# Patient Record
Sex: Male | Born: 1975 | Race: Black or African American | Hispanic: No | Marital: Married | Smoking: Never smoker
Health system: Southern US, Community
[De-identification: ages and names within clinical notes are randomized; demographics above are authoritative.]

## PROBLEM LIST (undated history)

## (undated) DIAGNOSIS — Z8739 Personal history of other diseases of the musculoskeletal system and connective tissue: Secondary | ICD-10-CM

## (undated) HISTORY — DX: Personal history of other diseases of the musculoskeletal system and connective tissue: Z87.39

## (undated) HISTORY — PX: NO PAST SURGERIES: SHX2092

---

## 2004-11-01 ENCOUNTER — Ambulatory Visit: Payer: Self-pay | Admitting: Family Medicine

## 2006-08-01 ENCOUNTER — Ambulatory Visit: Payer: Self-pay | Admitting: Family Medicine

## 2006-08-01 LAB — CONVERTED CEMR LAB
ALT: 25 units/L (ref 0–40)
AST: 22 units/L (ref 0–37)
Albumin: 4.1 g/dL (ref 3.5–5.2)
Basophils Absolute: 0 10*3/uL (ref 0.0–0.1)
Calcium: 9.7 mg/dL (ref 8.4–10.5)
Chloride: 106 meq/L (ref 96–112)
Creatinine, Ser: 1.1 mg/dL (ref 0.4–1.5)
Eosinophils Absolute: 0.1 10*3/uL (ref 0.0–0.6)
Eosinophils Relative: 2.6 % (ref 0.0–5.0)
GFR calc non Af Amer: 83 mL/min
LDL Cholesterol: 46 mg/dL (ref 0–99)
MCHC: 33.7 g/dL (ref 30.0–36.0)
MCV: 81 fL (ref 78.0–100.0)
Platelets: 230 10*3/uL (ref 150–400)
RBC: 5.42 M/uL (ref 4.22–5.81)
RDW: 13.2 % (ref 11.5–14.6)
Total CHOL/HDL Ratio: 2.5
Triglycerides: 37 mg/dL (ref 0–149)
WBC: 3.6 10*3/uL — ABNORMAL LOW (ref 4.5–10.5)

## 2006-08-08 ENCOUNTER — Ambulatory Visit: Payer: Self-pay | Admitting: Family Medicine

## 2007-03-21 DIAGNOSIS — J302 Other seasonal allergic rhinitis: Secondary | ICD-10-CM | POA: Insufficient documentation

## 2007-12-24 ENCOUNTER — Ambulatory Visit: Payer: Self-pay | Admitting: Family Medicine

## 2007-12-24 LAB — CONVERTED CEMR LAB
ALT: 18 units/L (ref 0–53)
Basophils Absolute: 0 10*3/uL (ref 0.0–0.1)
Bilirubin, Direct: 0.1 mg/dL (ref 0.0–0.3)
Blood in Urine, dipstick: NEGATIVE
CO2: 30 meq/L (ref 19–32)
Calcium: 9.4 mg/dL (ref 8.4–10.5)
Cholesterol: 109 mg/dL (ref 0–200)
HDL: 37.3 mg/dL — ABNORMAL LOW (ref >39.0)
Hemoglobin: 14.6 g/dL (ref 13.0–17.0)
Ketones, urine, test strip: NEGATIVE
LDL Cholesterol: 58 mg/dL (ref 0–99)
Lymphocytes Relative: 39.5 % (ref 12.0–46.0)
MCHC: 33.8 g/dL (ref 30.0–36.0)
Neutro Abs: 2.2 10*3/uL (ref 1.4–7.7)
Neutrophils Relative %: 48.5 % (ref 43.0–77.0)
Nitrite: NEGATIVE
Protein, U semiquant: NEGATIVE
RDW: 13.4 % (ref 11.5–14.6)
Sodium: 141 meq/L (ref 135–145)
Total Bilirubin: 0.7 mg/dL (ref 0.3–1.2)
Triglycerides: 69 mg/dL (ref 0–149)
Urobilinogen, UA: 0.2
VLDL: 14 mg/dL (ref 0–40)

## 2007-12-30 ENCOUNTER — Ambulatory Visit: Payer: Self-pay | Admitting: Family Medicine

## 2008-06-09 DIAGNOSIS — J069 Acute upper respiratory infection, unspecified: Secondary | ICD-10-CM | POA: Insufficient documentation

## 2008-06-09 DIAGNOSIS — M545 Low back pain, unspecified: Secondary | ICD-10-CM | POA: Insufficient documentation

## 2008-06-23 ENCOUNTER — Ambulatory Visit: Payer: Self-pay | Admitting: Family Medicine

## 2010-09-15 ENCOUNTER — Other Ambulatory Visit: Payer: Self-pay

## 2010-09-22 ENCOUNTER — Ambulatory Visit: Payer: Self-pay | Admitting: Family Medicine

## 2010-10-13 ENCOUNTER — Ambulatory Visit (INDEPENDENT_AMBULATORY_CARE_PROVIDER_SITE_OTHER): Payer: BC Managed Care – PPO | Admitting: Family Medicine

## 2010-10-13 ENCOUNTER — Encounter: Payer: Self-pay | Admitting: Family Medicine

## 2010-10-13 VITALS — BP 132/80 | Temp 98.1°F | Ht 76.0 in | Wt 250.0 lb

## 2010-10-13 DIAGNOSIS — M545 Low back pain, unspecified: Secondary | ICD-10-CM

## 2010-10-13 MED ORDER — PREDNISONE 20 MG PO TABS: ORAL_TABLET | ORAL | Status: DC

## 2010-10-13 NOTE — Progress Notes (Signed)
  Subjective:    Patient ID: Ricky Watkins, male    DOB: September 29, 1975, 35 y.o.   MRN: [id number]  HPI M. Is a 35 year old, married man, nonsmoker, who comes in today for evaluation of low back pain.  Two weeks ago he began having low back pain.  He describes it as central and he points to the L5-S1 disk area as the source of his pain.  He states it comes and goes.  When he sitting his pain free if he twists it hurts.  It all at this last for a few seconds and goes away.  Is able to sleep at night.  The intensity of the pain is a 6 on a scale of one to 10.  He had a similar episode like this for 5 years ago.  We gave him a short course of prednisone and his pain went away.   Review of Systems    General muscle disk L2 review of systems otherwise negative Objective:   Physical Exam Of the upper nurse menarche at distress.  Examination spine is normal and lower extremities.  Strength sensation, reflexes, all within normal limits.  Straight leg raising negative to       Assessment & Plan:  Central lumbar disk with normal neurologic exam.  Plan prednisone burst and taper, PT if symptoms do not abate

## 2010-10-13 NOTE — Patient Instructions (Signed)
Take the prednisone as directed.  If you don't see that your symptoms are improving.  Call us and we will get she set up for physical therapy.  Remember to do the stretching exercises 15 minutes twice daily

## 2010-11-28 ENCOUNTER — Encounter: Payer: Self-pay | Admitting: Family Medicine

## 2010-11-28 ENCOUNTER — Ambulatory Visit (INDEPENDENT_AMBULATORY_CARE_PROVIDER_SITE_OTHER): Payer: BC Managed Care – PPO | Admitting: Family Medicine

## 2010-11-28 VITALS — BP 124/84 | Temp 98.3°F | Wt 250.0 lb

## 2010-11-28 DIAGNOSIS — M545 Low back pain, unspecified: Secondary | ICD-10-CM

## 2010-11-28 NOTE — Progress Notes (Signed)
  Subjective:    Patient ID: Ricky Watkins, male    DOB: 02/01/1976, 35 y.o.   MRN: [id number]  HPImaurice  Is a 35 y/o male Who comes in today for evaluation of low back pain.  We saw him a month ago with severe low back pain.  At that time, we felt he had an L4, L5.  The left lateral disk with no neurologic deficit.  We therefore, start him on a short course of prednisone with home exercises.  He tapered his prednisone felt well and in his back pain flared up again.  A week ago.  He restarted the prednisone is now down to 20 mg a day.  He said his pain is decreased, but not gone.  He describes it as sometimes sharp sometimes dull points to the left lateral SI joint as a source of his pain.  He occasionally has some tingling in his left leg, but does not know go below his knee.  His sensation and muscle strength.  Oral normal.  Historically, he said, recurrent problems like this in the past and has gotten better in the past with physical therapy.  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  He was given a week out which is due to and tomorrow.  The physician at work recommend that he have an MRI however, without any neurologic deficit.  I do not think an MRI would be fruitful at this time    Review of Systems    General and musculoskeletal and neurologic review of systems otherwise negative Objective:   Physical Exam    Well-developed well-nourished, muscular male in no acute distress.  Examination of the spine is normal.  No palpable tenderness.  In the supine position, and sensation.  Muscle strength reflexes all normal.  Straight leg raising negative however, he does have extremely tight hamstrings 35 to 45 degrees limitation   Assessment & Plan:  Lumbar disk disease,,,,,,,,,,,, Taper prednisone,,,,,,,,, begin physical therapy,,,,,,,,, consider epidural steroid injection.  If symptoms do not improve with  physical therapy,,,,,,,,,,,,, should stay out of the line of duty until he is 100% well

## 2010-11-28 NOTE — Patient Instructions (Signed)
Taper the prednisone by taking a half a tablet a day, x 3 days, then half a tablet Monday, Wednesday, Friday, for a two week taper.  Once u r   finished the prednisone begin Motrin 600 b.i.d. With food.  We will start u  on physical therapy ASAP.  Check at work to see if you can do something inside for the next two to 4 weeks until we can get you pain free.  If the physical therapy does not work then that we would go and get an epidural steroid injection at the pain center

## 2010-12-12 ENCOUNTER — Ambulatory Visit: Payer: BC Managed Care – PPO | Attending: Family Medicine

## 2010-12-12 DIAGNOSIS — M25569 Pain in unspecified knee: Secondary | ICD-10-CM | POA: Insufficient documentation

## 2010-12-12 DIAGNOSIS — IMO0001 Reserved for inherently not codable concepts without codable children: Secondary | ICD-10-CM | POA: Insufficient documentation

## 2010-12-12 DIAGNOSIS — M25559 Pain in unspecified hip: Secondary | ICD-10-CM | POA: Insufficient documentation

## 2010-12-15 ENCOUNTER — Ambulatory Visit: Payer: BC Managed Care – PPO | Admitting: Physical Therapy

## 2010-12-23 ENCOUNTER — Telehealth: Payer: Self-pay | Admitting: *Deleted

## 2010-12-23 DIAGNOSIS — M545 Low back pain, unspecified: Secondary | ICD-10-CM

## 2010-12-23 NOTE — Telephone Encounter (Signed)
Have Dr. Tawanna Cooler see about this

## 2010-12-23 NOTE — Telephone Encounter (Signed)
Pt. Wants to change PT locations.  He cannot get enough appts to fulfill his PT plan of treatment.

## 2010-12-26 ENCOUNTER — Encounter: Payer: Self-pay | Admitting: Family Medicine

## 2010-12-26 ENCOUNTER — Ambulatory Visit (INDEPENDENT_AMBULATORY_CARE_PROVIDER_SITE_OTHER): Payer: BC Managed Care – PPO | Admitting: Family Medicine

## 2010-12-26 ENCOUNTER — Ambulatory Visit (INDEPENDENT_AMBULATORY_CARE_PROVIDER_SITE_OTHER)
Admission: RE | Admit: 2010-12-26 | Discharge: 2010-12-26 | Disposition: A | Discharge status: Home / Self Care | Payer: BC Managed Care – PPO | Source: Ambulatory Visit | Attending: Family Medicine | Admitting: Family Medicine

## 2010-12-26 VITALS — BP 130/96 | Temp 98.0°F | Wt 248.0 lb

## 2010-12-26 DIAGNOSIS — M545 Low back pain, unspecified: Secondary | ICD-10-CM

## 2010-12-26 MED ORDER — HYDROCODONE-ACETAMINOPHEN 7.5-750 MG PO TABS: ORAL_TABLET | ORAL | Status: DC

## 2010-12-26 NOTE — Patient Instructions (Signed)
Go to the main office now for x-rays of your spine.  We will get set up for an MRI.  Vicodin one half to one tablet 3 to 4 times daily as needed for pain.  We will also get set up for a neurosurgical consult for further evaluation

## 2010-12-26 NOTE — Telephone Encounter (Signed)
Please send this message to terri,,,,,,,,,, located change locations

## 2010-12-26 NOTE — Progress Notes (Signed)
Addended by: Kern Reap B on: 12/26/2010 04:46 PM   Modules accepted: Orders

## 2010-12-26 NOTE — Progress Notes (Signed)
  Subjective:    Patient ID: Ricky Watkins, male    DOB: 1976-04-20, 35 y.o.   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  We gave him a burst and taper of prednisone and started physical therapy.  This has worked in the past, however, this time.  It did work.  He's been off his prednisone and still has persistent pain.  He is to types of pain.  One is a constant, dull pain, which he describes as about 5 the other is a severe, sharp pain that he describes as an ache that radiates down to his left mid calf.  The pain is constant and hurts worse by sitting somewhat better if he lies down.   Review of Systems    General and neurologic review of systems negative Objective:   Physical Exam    Well-developed well-nourished, male in no acute distress.  Neurologic exam exam unchanged   Assessment & Plan:  Lumbar disk disease, unresolved, and unresponsive to conservative therapy.  Plan Will get plain x-rays, MRI, referred to neurosurgery for eval

## 2010-12-27 ENCOUNTER — Other Ambulatory Visit: Payer: Self-pay | Admitting: Family Medicine

## 2010-12-27 DIAGNOSIS — M545 Low back pain, unspecified: Secondary | ICD-10-CM

## 2010-12-29 NOTE — Progress Notes (Signed)
patient  Is aware 

## 2011-01-02 ENCOUNTER — Ambulatory Visit
Admission: RE | Admit: 2011-01-02 | Discharge: 2011-01-02 | Disposition: A | Discharge status: Home / Self Care | Payer: BC Managed Care – PPO | Source: Ambulatory Visit | Attending: Family Medicine | Admitting: Family Medicine

## 2011-01-02 ENCOUNTER — Other Ambulatory Visit: Payer: BC Managed Care – PPO

## 2011-01-02 ENCOUNTER — Inpatient Hospital Stay: Admission: RE | Admit: 2011-01-02 | Payer: BC Managed Care – PPO | Source: Ambulatory Visit

## 2011-01-02 ENCOUNTER — Other Ambulatory Visit: Payer: Self-pay | Admitting: Family Medicine

## 2011-01-02 DIAGNOSIS — M545 Low back pain, unspecified: Secondary | ICD-10-CM

## 2011-01-04 NOTE — Progress Notes (Signed)
patient  Is aware and went to dr elsner's office today.  He will call back when he is ready for PT

## 2011-01-06 ENCOUNTER — Ambulatory Visit: Payer: BC Managed Care – PPO

## 2011-01-09 ENCOUNTER — Telehealth: Payer: Self-pay | Admitting: *Deleted

## 2011-01-09 NOTE — Telephone Encounter (Signed)
ok 

## 2011-01-09 NOTE — Telephone Encounter (Signed)
patient  Would like a note stating he may return to work

## 2011-01-10 NOTE — Telephone Encounter (Signed)
Letter ready and patient is aware 

## 2011-07-12 ENCOUNTER — Encounter: Payer: Self-pay | Admitting: Family Medicine

## 2011-07-13 ENCOUNTER — Ambulatory Visit: Payer: BC Managed Care – PPO | Admitting: Family Medicine

## 2011-07-27 ENCOUNTER — Ambulatory Visit: Payer: BC Managed Care – PPO | Admitting: Family Medicine

## 2011-09-06 ENCOUNTER — Other Ambulatory Visit: Payer: Self-pay | Admitting: Family Medicine

## 2011-10-18 ENCOUNTER — Telehealth: Payer: Self-pay | Admitting: Family Medicine

## 2011-10-18 MED ORDER — BENZOYL PEROXIDE-ERYTHROMYCIN 5-3 % EX GEL: Freq: Every day | CUTANEOUS | Status: DC

## 2011-10-18 NOTE — Telephone Encounter (Signed)
Patient called stating that he need a refill of his benzoyl peroxide-erythromycin sent to his pharmacy [facility] in [place] [address]. Please assist.

## 2012-10-17 ENCOUNTER — Other Ambulatory Visit: Payer: Self-pay | Admitting: Family Medicine

## 2013-03-02 ENCOUNTER — Other Ambulatory Visit: Payer: Self-pay | Admitting: Family Medicine

## 2013-07-14 ENCOUNTER — Other Ambulatory Visit: Payer: Self-pay

## 2013-07-14 ENCOUNTER — Telehealth: Payer: Self-pay | Admitting: Family Medicine

## 2013-07-14 ENCOUNTER — Emergency Department (HOSPITAL_COMMUNITY): Payer: BC Managed Care – PPO

## 2013-07-14 ENCOUNTER — Emergency Department (HOSPITAL_COMMUNITY)
Admission: EM | Admit: 2013-07-14 | Discharge: 2013-07-14 | Disposition: A | Discharge status: Home / Self Care | Payer: BC Managed Care – PPO | Attending: Emergency Medicine | Admitting: Emergency Medicine

## 2013-07-14 ENCOUNTER — Encounter (HOSPITAL_COMMUNITY): Payer: Self-pay | Admitting: Emergency Medicine

## 2013-07-14 DIAGNOSIS — J989 Respiratory disorder, unspecified: Secondary | ICD-10-CM

## 2013-07-14 DIAGNOSIS — R0989 Other specified symptoms and signs involving the circulatory and respiratory systems: Secondary | ICD-10-CM | POA: Insufficient documentation

## 2013-07-14 DIAGNOSIS — J45909 Unspecified asthma, uncomplicated: Secondary | ICD-10-CM | POA: Insufficient documentation

## 2013-07-14 DIAGNOSIS — J309 Allergic rhinitis, unspecified: Secondary | ICD-10-CM | POA: Insufficient documentation

## 2013-07-14 LAB — BASIC METABOLIC PANEL
BUN: 13 mg/dL (ref 6–23)
CALCIUM: 9.8 mg/dL (ref 8.4–10.5)
CO2: 28 mEq/L (ref 19–32)
Chloride: 100 mEq/L (ref 96–112)
Creatinine, Ser: 1.04 mg/dL (ref 0.50–1.35)
GFR, EST NON AFRICAN AMERICAN: 90 mL/min — AB (ref >90)
GLUCOSE: 103 mg/dL — AB (ref 70–99)
POTASSIUM: 4.9 meq/L (ref 3.7–5.3)
SODIUM: 139 meq/L (ref 137–147)

## 2013-07-14 LAB — CBC
HCT: 43.5 % (ref 39.0–52.0)
HEMOGLOBIN: 14.7 g/dL (ref 13.0–17.0)
MCH: 27.4 pg (ref 26.0–34.0)
MCHC: 33.8 g/dL (ref 30.0–36.0)
MCV: 81.2 fL (ref 78.0–100.0)
PLATELETS: 187 10*3/uL (ref 150–400)
RBC: 5.36 MIL/uL (ref 4.22–5.81)
RDW: 13.8 % (ref 11.5–15.5)
WBC: 6.4 10*3/uL (ref 4.0–10.5)

## 2013-07-14 LAB — POCT I-STAT TROPONIN I: TROPONIN I, POC: 0.01 ng/mL (ref 0.00–0.08)

## 2013-07-14 MED ORDER — PREDNISONE 10 MG PO TABS: 20.0000 mg | ORAL_TABLET | Freq: Every day | ORAL | Status: DC

## 2013-07-14 MED ORDER — ALBUTEROL SULFATE HFA 108 (90 BASE) MCG/ACT IN AERS
2.0000 | INHALATION_SPRAY | RESPIRATORY_TRACT | Status: DC | PRN
  Administered 2013-07-14: 2 via RESPIRATORY_TRACT
  Filled 2013-07-14: qty 6.7

## 2013-07-14 MED ORDER — ALBUTEROL SULFATE (2.5 MG/3ML) 0.083% IN NEBU
5.0000 mg | INHALATION_SOLUTION | Freq: Once | RESPIRATORY_TRACT | Status: AC
  Administered 2013-07-14: 5 mg via RESPIRATORY_TRACT
  Filled 2013-07-14: qty 6

## 2013-07-14 MED ORDER — IPRATROPIUM BROMIDE 0.02 % IN SOLN
0.5000 mg | Freq: Once | RESPIRATORY_TRACT | Status: AC
  Administered 2013-07-14: 0.5 mg via RESPIRATORY_TRACT
  Filled 2013-07-14: qty 2.5

## 2013-07-14 MED ORDER — PREDNISONE 20 MG PO TABS
60.0000 mg | ORAL_TABLET | Freq: Once | ORAL | Status: AC
  Administered 2013-07-14: 60 mg via ORAL
  Filled 2013-07-14: qty 3

## 2013-07-14 NOTE — Telephone Encounter (Signed)
FYI! PT to follow CAN advise.

## 2013-07-14 NOTE — Discharge Instructions (Signed)
Asthma, Acute Bronchospasm °Acute bronchospasm caused by asthma is also referred to as an asthma attack. Bronchospasm means your air passages become narrowed. The narrowing is caused by inflammation and tightening of the muscles in the air tubes (bronchi) in your lungs. This can make it hard to breath or cause you to wheeze and cough. °CAUSES °Possible triggers are: °· Animal dander from the skin, hair, or feathers of animals. °· Dust mites contained in house dust. °· Cockroaches. °· Pollen from trees or grass. °· Mold. °· Cigarette or tobacco smoke. °· Air pollutants such as dust, household cleaners, hair sprays, aerosol sprays, paint fumes, strong chemicals, or strong odors. °· Cold air or weather changes. Cold air may trigger inflammation. Winds increase molds and pollens in the air. °· Strong emotions such as crying or laughing hard. °· Stress. °· Certain medicines such as aspirin or beta-blockers. °· Sulfites in foods and drinks, such as dried fruits and wine. °· Infections or inflammatory conditions, such as a flu, cold, or inflammation of the nasal membranes (rhinitis). °· Gastroesophageal reflux disease (GERD). GERD is a condition where stomach acid backs up into your throat (esophagus). °· Exercise or strenuous activity. °SIGNS AND SYMPTOMS  °· Wheezing. °· Excessive coughing, particularly at night. °· Chest tightness. °· Shortness of breath. °DIAGNOSIS  °Your health care provider will ask you about your medical history and perform a physical exam. A chest X-ray or blood testing may be performed to look for other causes of your symptoms or other conditions that may have triggered your asthma attack.  °TREATMENT  °Treatment is aimed at reducing inflammation and opening up the airways in your lungs.  Most asthma attacks are treated with inhaled medicines. These include quick relief or rescue medicines (such as bronchodilators) and controller medicines (such as inhaled corticosteroids). These medicines are  sometimes given through an inhaler or a nebulizer. Systemic steroid medicine taken by mouth or given through an IV tube also can be used to reduce the inflammation when an attack is moderate or severe. Antibiotic medicines are only used if a bacterial infection is present.  °HOME CARE INSTRUCTIONS  °· Rest. °· Drink plenty of liquids. This helps the mucus to remain thin and be easily coughed up. Only use caffeine in moderation and do not use alcohol until you have recovered from your illness. °· Do not smoke. Avoid being exposed to secondhand smoke. °· You play a critical role in keeping yourself in good health. Avoid exposure to things that cause you to wheeze or to have breathing problems. °· Keep your medicines up to date and available. Carefully follow your health care provider's treatment plan. °· Take your medicine exactly as prescribed. °· When pollen or pollution is bad, keep windows closed and use an air conditioner or go to places with air conditioning. °· Asthma requires careful medical care. See your health care provider for a follow-up as advised. If you are more than [redacted] weeks pregnant and you were prescribed any new medicines, let your obstetrician know about the visit and how you are doing. Follow-up with your health care provider as directed. °· After you have recovered from your asthma attack, make an appointment with your outpatient doctor to talk about ways to reduce the likelihood of future attacks. If you do not have a doctor who manages your asthma, make an appointment with a primary care doctor to discuss your asthma. °SEEK IMMEDIATE MEDICAL CARE IF:  °· You are getting worse. °· You have trouble breathing. If severe, call   your local emergency services (911 in the [place] °· You develop chest pain or discomfort. °· You are vomiting. °· You are not able to keep fluids down. °· You are coughing up yellow, green, brown, or bloody sputum. °· You have a fever and your symptoms suddenly get  worse. °· You have trouble swallowing. °MAKE SURE YOU:  °· Understand these instructions. °· Will watch your condition. °· Will get help right away if you are not doing well or get worse. °Document Released: 09/27/2006 Document Revised: 02/12/2013 Document Reviewed: 12/18/2012 °Exit[employer]® Patient Information ©2014 [employer]. ° °

## 2013-07-14 NOTE — ED Provider Notes (Signed)
Medical screening examination/treatment/procedure(s) were performed by non-physician practitioner and as supervising physician I was immediately available for consultation/collaboration.  EKG Interpretation    Date/Time:  Monday July 14 2013 11:32:10 EST Ventricular Rate:  84 PR Interval:  178 QRS Duration: 84 QT Interval:  318 QTC Calculation: 375 R Axis:   73 Text Interpretation:  Normal sinus rhythm Early repolarization Normal ECG No previous tracing Confirmed by Maryan Rued  MD, Rindy Kollman ([id number]) on 07/14/2013 1:29:33 PM              Blanchie Dessert, MD 07/14/13 1608

## 2013-07-14 NOTE — Telephone Encounter (Signed)
Patient Information:  Caller Name: Marcelina Morel  Phone: [phone]  Patient: Ricky Watkins, Ricky Watkins  Gender: Male  DOB: 1975-10-29  Age: 38 Years  PCP: [facility])  Office Follow Up:  Does the office need to follow up with this patient?: No  Instructions For The Office: N/A  RN Note:  Substernal chest pain described as pressure and present now "like an elephant standing on my chest." Pain is worsened with deep inspiration but present without deep breath.  Symptoms  Reason For Call & Symptoms: Substernal chest pain up to throat and shoulder to shoulder since 2100 07/13/13; pain worsened when got home at 0430 07/14/13 and if lays down.  Caller related pain onset to wearing tight bullet proof vest and standing outside in cold air.  Reviewed Health History In EMR: Yes  Reviewed Medications In EMR: Yes  Reviewed Allergies In EMR: Yes  Reviewed Surgeries / Procedures: Yes  Date of Onset of Symptoms: 07/13/2013  Guideline(s) Used:  Chest Pain  Disposition Per Guideline:   Call EMS 911 Now  Reason For Disposition Reached:   Chest pain lasting longer than 5 minutes and ANY of the following:  Over 2 years old Over 87 years old and at least one cardiac risk factor (i.e., high blood pressure, diabetes, high cholesterol, obesity, smoker or strong family history of heart disease) Pain is crushing, pressure-like, or heavy  Took nitroglycerin and chest pain was not relieved History of heart disease (i.e., angina, heart attack, bypass surgery, angioplasty, CHF)  Advice Given:  N/A  Patient Will Follow Care Advice:  YES

## 2013-07-14 NOTE — ED Notes (Signed)
PT c/o chest tightness since last night.  States was tx for bronchitis 3 weeks ago and this feels the same.  Tightness increases with inspiration.

## 2013-07-14 NOTE — ED Provider Notes (Signed)
CSN: [id number]     Arrival date & time 07/14/13  1129 History   First MD Initiated Contact with Patient 07/14/13 1256     Chief Complaint  Patient presents with  . Chest Pain   (Consider location/radiation/quality/duration/timing/severity/associated sxs/prior Treatment) HPI  Patient to is otherwise a healthy 38 year old male presents to the emergency department for evaluation of chest tightness. 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 He states that the tightness and pain started during the training. He denies feeling short of breath but felt as though his lungs were tight. He tried loosening his bullet proof best but it did not help much. He does not have an inhaler to try at home. He denies having cardiac history or history of chest pains. He denies feeling diaphoretic, short of breath, nausea, weakness during this episode. He laid down to go to sleep that night he still felt a tightness in his lungs but in the morning woke up asymptomatic.  Past Medical History  Diagnosis Date  . Allergic rhinitis    History reviewed. No pertinent past surgical history. No family history on file. History  Substance Use Topics  . Smoking status: Never Smoker   . Smokeless tobacco: Never Used  . Alcohol Use: Yes     Comment: rarley    Review of Systems The patient denies anorexia, fever, weight loss,, vision loss, decreased hearing, hoarseness, chest pain, syncope, dyspnea on exertion, peripheral edema, balance deficits, hemoptysis, abdominal pain, melena, hematochezia, severe indigestion/heartburn, hematuria, incontinence, genital sores, muscle weakness, suspicious skin lesions, transient blindness, difficulty walking, depression, unusual weight change, abnormal bleeding, enlarged lymph nodes, angioedema, and breast masses.   Allergies  Review of patient's allergies indicates no known  allergies.  Home Medications   Current Outpatient Rx  Name  Route  Sig  Dispense  Refill  . cetirizine (ZYRTEC) 10 MG tablet   Oral   Take 10 mg by mouth daily.           . mometasone (NASONEX) 50 MCG/ACT nasal spray   Nasal   Place 1 spray into the nose daily as needed (allergies). Each nostril         . Multiple Vitamin (MULTIVITAMIN) capsule   Oral   Take 1 capsule by mouth every 3 (three) days.         . predniSONE (DELTASONE) 10 MG tablet   Oral   Take 2 tablets (20 mg total) by mouth daily.   21 tablet   0     Prednisone dose pack directions:   6 tabs on day ...    BP 136/68  Pulse 91  Temp(Src) 97.8 F (36.6 C) (Oral)  Resp 12  Ht 6\' 5"  (1.956 m)  Wt 229 lb 4.8 oz (104.01 kg)  BMI 27.19 kg/m2  SpO2 98% Physical Exam  Nursing note and vitals reviewed. Constitutional: He appears well-developed and well-nourished. No distress.  HENT:  Head: Normocephalic and atraumatic.  Eyes: Pupils are equal, round, and reactive to light.  Neck: Normal range of motion. Neck supple.  Cardiovascular: Normal rate and regular rhythm.   Pulmonary/Chest: Effort normal. He has decreased breath sounds. He has wheezes (mild wheezing after breathing treatment). He has no rhonchi.  Abdominal: Soft.  Neurological: He is alert.  Skin: Skin is warm and dry.    ED Course  Procedures (including critical care time) Labs Review Labs  Reviewed  BASIC METABOLIC PANEL - Abnormal; Notable for the following:    Glucose, Bld 103 (*)    GFR calc non Af Amer 90 (*)    All other components within normal limits  CBC  POCT I-STAT TROPONIN I   Imaging Review Dg Chest 2 View (if Patient Has Fever And/or Copd)  07/14/2013   CLINICAL DATA:  Chest pain shortness of breath.  EXAM: CHEST  2 VIEW  COMPARISON:  None.  FINDINGS: The heart size and mediastinal contours are within normal limits. Both lungs are clear. The visualized skeletal structures are unremarkable.  IMPRESSION: No active  cardiopulmonary disease.   Electronically Signed   By: Marcello Moores  Register   On: 07/14/2013 12:59    EKG Interpretation    Date/Time:  Monday July 14 2013 11:32:10 EST Ventricular Rate:  84 PR Interval:  178 QRS Duration: 84 QT Interval:  318 QTC Calculation: 375 R Axis:   73 Text Interpretation:  Normal sinus rhythm Early repolarization Normal ECG No previous tracing Confirmed by Maryan Rued  MD, WHITNEY ([id number]) on 07/14/2013 1:29:33 PM            MDM   1. Reactive airway disease that is not asthma    Chest xray and labs are all WNL. Patient having decrease airway movement  Given a duo neb in the ED which he says significantly helped his symptoms. On re-evaluation he has improved airway movement on ascultation. Will give RX for albuterol inhaler and prednisone taper.  38 y.o.Torence L Eoff's evaluation in the Emergency Department is complete. It has been determined that no acute conditions requiring further emergency intervention are present at this time. The patient/guardian have been advised of the diagnosis and plan. We have discussed signs and symptoms that warrant return to the ED, such as changes or worsening in symptoms.  Vital signs are stable at discharge. Filed Vitals:   07/14/13 1400  BP: 136/68  Pulse:   Temp:   Resp: 12    Patient/guardian has voiced understanding and agreed to follow-up with the PCP or specialist.     Linus Mako, PA-C 07/14/13 1429

## 2013-08-11 ENCOUNTER — Encounter (HOSPITAL_COMMUNITY): Payer: Self-pay | Admitting: Emergency Medicine

## 2013-08-11 ENCOUNTER — Emergency Department (INDEPENDENT_AMBULATORY_CARE_PROVIDER_SITE_OTHER)
Admission: EM | Admit: 2013-08-11 | Discharge: 2013-08-11 | Disposition: A | Discharge status: Home / Self Care | Payer: BC Managed Care – PPO | Source: Home / Self Care | Attending: Family Medicine | Admitting: Family Medicine

## 2013-08-11 DIAGNOSIS — S76309A Unspecified injury of muscle, fascia and tendon of the posterior muscle group at thigh level, unspecified thigh, initial encounter: Secondary | ICD-10-CM

## 2013-08-11 DIAGNOSIS — S79929A Unspecified injury of unspecified thigh, initial encounter: Secondary | ICD-10-CM

## 2013-08-11 DIAGNOSIS — S79919A Unspecified injury of unspecified hip, initial encounter: Secondary | ICD-10-CM

## 2013-08-11 DIAGNOSIS — S6000XA Contusion of unspecified finger without damage to nail, initial encounter: Secondary | ICD-10-CM

## 2013-08-11 NOTE — ED Provider Notes (Signed)
Ricky Watkins is a 38 y.o. male who presents to Urgent Care today for left hamstring and left hand pain. 1) left hamstring: Patient slipped while in the gym about one week ago. He felt a pulling sensation in his distal left hamstring. He's developed bruising in his distal hamstring. He notes mild pain but feels well otherwise. He has no trouble running or jumping. He feels well.  2) left hand: During the fall the gym he jammed his left second and third PIP. He denies any significant pain. He denies any limitation with his activities such as gripping or extension. He feels well. Mild pain. No medications tried.  No radiating pain weakness or numbness  Social history: Patient works as a Engineer, structural Past Medical History  Diagnosis Date  . Allergic rhinitis    History  Substance Use Topics  . Smoking status: Never Smoker   . Smokeless tobacco: Never Used  . Alcohol Use: Yes     Comment: rarley   ROS as above Medications: No current facility-administered medications for this encounter.   Current Outpatient Prescriptions  Medication Sig Dispense Refill  . cetirizine (ZYRTEC) 10 MG tablet Take 10 mg by mouth daily.        . mometasone (NASONEX) 50 MCG/ACT nasal spray Place 1 spray into the nose daily as needed (allergies). Each nostril      . Multiple Vitamin (MULTIVITAMIN) capsule Take 1 capsule by mouth every 3 (three) days.        Exam:  BP 129/84  Pulse 85  Temp(Src) 98.3 F (36.8 C) (Oral)  Resp 16  SpO2 96% Gen: Well NAD Left hand: Normal-appearing no swelling. Mildly tender left second and third PIP Normal motion. Normal extension and flexion strength are capillary refill and sensation are intact distally.  Contralateral right hand is normal  Left hamstring: Ecchymosis present distal medial tibia to the medial calf.  Not particularly tender. No defects palpated Normal H-test.  Strength is intact.   Contralateral right hamstring is normal:    Limited  musculoskeletal ultrasound of the left distal hamstring:  Heterogeneity superficially along the area of ecchymosis and tenderness consistent with hematoma. No large defect noted in the hamstring tendons. Slight small irregularity of the semitendinosus distally. Otherwise normal   Assessment and Plan: 38 y.o. male with  1) partial tear left hamstring:  Doing well.  Plan for Askling Exercises.  2) left hand contusion: NSAIDs as needed  Discussed warning signs or symptoms. Please see discharge instructions. Patient expresses understanding.    Gregor Hams, MD 08/11/13 [phone]

## 2013-08-11 NOTE — Discharge Instructions (Signed)
Thank you for coming in today. Askling hamstring exercises.  Follow up with Dr. Tamala Julian as needed.  Take up to 2 aleve twice daily for pain as needed.  Hamstring Strain  Hamstrings are the large muscles in the back of the thighs. A strain or tear injury happens when there is a sudden stretch or pull on these muscles and tendons. Tendons are cord like structures that attach muscle to bone. These injuries are commonly seen in activities such as sprinting due to sudden acceleration.  DIAGNOSIS  Often the diagnosis can be made by examination. HOME CARE INSTRUCTIONS   Apply ice to the sore area for 15-81minutes, 03-04 times per day. Do this while awake for the first 2 days. Put the ice in a plastic bag, and place a towel between the bag of ice and your skin.  Keep your knee flexed when possible. This means your foot is held off the ground slightly if you are on crutches. When lying down, a pillow under the knee will take strain off the muscles and provide some relief.  If a compression bandage such as an ace wrap was applied, use it until you are seen again. You may remove it for sleeping, showers and baths. If the wrap seems to be too tight and is uncomfortable, wrap it more loosely. If your toes or foot are getting cold or blue, it is too tight.  Walk or move around as the pain allows, or as instructed. Resume full activities as suggested by your caregiver. This is often safest when the strength of the injured leg has nearly returned to normal.  Only take over-the-counter or prescription medicines for pain, discomfort, or fever as directed by your caregiver. SEEK MEDICAL CARE IF:   You have an increase in bruising, swelling or pain.  You notice coldness or blueness of your toes or foot.  Pain relief is not obtained with medications.  You have increasing pain in the area and seem to be getting worse rather than better.  You notice your thigh getting larger in size (this could indicate bleeding  into the muscle). Document Released: 03/07/2001 Document Revised: 09/04/2011 Document Reviewed: 06/14/2008 [facility] Patient Information 2014 [place], [place].

## 2013-08-11 NOTE — ED Notes (Signed)
States he has pain in left hand and left posterior thigh that resulted from workout 1 week ago . Wife ENCOURAGED him to be evaluated

## 2014-03-02 ENCOUNTER — Other Ambulatory Visit: Payer: Self-pay | Admitting: Family Medicine

## 2014-09-14 ENCOUNTER — Encounter: Payer: Self-pay | Admitting: Family Medicine

## 2014-09-14 ENCOUNTER — Ambulatory Visit (INDEPENDENT_AMBULATORY_CARE_PROVIDER_SITE_OTHER): Payer: BC Managed Care – PPO | Admitting: Family Medicine

## 2014-09-14 VITALS — BP 130/90 | HR 80 | Temp 98.3°F | Ht 76.0 in | Wt 234.5 lb

## 2014-09-14 DIAGNOSIS — J302 Other seasonal allergic rhinitis: Secondary | ICD-10-CM

## 2014-09-14 MED ORDER — MOMETASONE FUROATE 50 MCG/ACT NA SUSP: 1.0000 | Freq: Every day | NASAL | Status: DC | PRN

## 2014-09-14 NOTE — Patient Instructions (Addendum)
Good to see you today, call us with questions. Return as needed or for physical at your convenience, prior fasting for blood work nasonex refilled.

## 2014-09-14 NOTE — Assessment & Plan Note (Signed)
Refilled nasonex per pt request. Discussed antihistamine use.

## 2014-09-14 NOTE — Progress Notes (Signed)
BP 130/90 mmHg  Pulse 80  Temp(Src) 98.3 F (36.8 C) (Oral)  Ht 6\' 4"  (1.93 m)  Wt 234 lb 8 oz (106.369 kg)  BMI 28.56 kg/m2   CC: transfer of care  Subjective:    Patient ID: Ricky Watkins, male    DOB: 08-12-1975, 39 y.o.   MRN: [id number]  HPI: Ricky Watkins is a 39 y.o. male presenting on 09/14/2014 for Establish Care   Prior saw Dr Christie Nottingham, last seen 12/2010.  Seasonal allergic rhinitis - currently no issues. Requests refill of nasonex. Both congestion and rhinorrhea. No watery eyes or sneezing. Environmental allergies - dust mites, grass.   Preventative: Last CPE 07/2013 at [facility] Requests we schedule at another date.  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 Interim medical history since our last visit reviewed. Allergies and medications reviewed and updated. Current Outpatient Prescriptions on File Prior to Visit  Medication Sig  . cetirizine (ZYRTEC) 10 MG tablet Take 10 mg by mouth daily.     No current facility-administered medications on file prior to visit.    Review of Systems Per HPI unless specifically indicated above     Objective:    BP 130/90 mmHg  Pulse 80  Temp(Src) 98.3 F (36.8 C) (Oral)  Ht 6\' 4"  (1.93 m)  Wt 234 lb 8 oz (106.369 kg)  BMI 28.56 kg/m2  Wt Readings from Last 3 Encounters:  09/14/14 234 lb 8 oz (106.369 kg)  07/14/13 229 lb 4.8 oz (104.01 kg)  12/26/10 248 lb (112.492 kg)    Physical Exam  Constitutional: He is oriented to person, place, and time. He appears well-developed and well-nourished. No distress.  HENT:  Head: Normocephalic and atraumatic.  Right Ear: Hearing, tympanic membrane, external ear and ear canal normal.  Left Ear: Hearing, tympanic membrane, external ear  and ear canal normal.  Nose: Nose normal.  Mouth/Throat: Uvula is midline, oropharynx is clear and moist and mucous membranes are normal. No oropharyngeal exudate, posterior oropharyngeal edema or posterior oropharyngeal erythema.  Eyes: Conjunctivae and EOM are normal. Pupils are equal, round, and reactive to light. No scleral icterus.  Gritty external surface of L contact lens  Neck: Normal range of motion. Neck supple. No thyromegaly present.  Cardiovascular: Normal rate, regular rhythm, normal heart sounds and intact distal pulses.   No murmur heard. Pulses:      Radial pulses are 2+ on the right side, and 2+ on the left side.  Pulmonary/Chest: Effort normal and breath sounds normal. No respiratory distress. He has no wheezes. He has no rales.  Musculoskeletal: Normal range of motion. He exhibits no edema.  Lymphadenopathy:    He has no cervical adenopathy.  Neurological: He is alert and oriented to person, place, and time.  CN grossly intact, station and gait intact  Skin: Skin is warm and dry. No rash noted.  Psychiatric: He has a normal mood and affect. His behavior is normal.  Nursing note and vitals reviewed.     Assessment & Plan:  I've asked pt to bring [place] copies of latest labwork and immunization record. I've also asked him to remove contacts daily.  Problem List Items Addressed This Visit    Seasonal allergic rhinitis - Primary    Refilled nasonex per pt request. Discussed  antihistamine use.          Follow up plan: Return as needed.

## 2014-09-14 NOTE — Progress Notes (Signed)
Pre visit review using our clinic review tool, if applicable. No additional management support is needed unless otherwise documented below in the visit note. 

## 2014-11-07 ENCOUNTER — Other Ambulatory Visit: Payer: Self-pay | Admitting: Family Medicine

## 2014-11-07 DIAGNOSIS — Z131 Encounter for screening for diabetes mellitus: Secondary | ICD-10-CM

## 2014-11-07 DIAGNOSIS — E786 Lipoprotein deficiency: Secondary | ICD-10-CM

## 2014-11-09 ENCOUNTER — Other Ambulatory Visit (INDEPENDENT_AMBULATORY_CARE_PROVIDER_SITE_OTHER): Payer: BC Managed Care – PPO

## 2014-11-09 DIAGNOSIS — E786 Lipoprotein deficiency: Secondary | ICD-10-CM

## 2014-11-09 DIAGNOSIS — Z131 Encounter for screening for diabetes mellitus: Secondary | ICD-10-CM

## 2014-11-09 LAB — LIPID PANEL
CHOL/HDL RATIO: 2
Cholesterol: 119 mg/dL (ref 0–200)
HDL: 61.6 mg/dL (ref >39.00)
LDL Cholesterol: 48 mg/dL (ref 0–99)
NONHDL: 57.4
Triglycerides: 47 mg/dL (ref 0.0–149.0)
VLDL: 9.4 mg/dL (ref 0.0–40.0)

## 2014-11-09 LAB — BASIC METABOLIC PANEL
BUN: 13 mg/dL (ref 6–23)
CO2: 32 meq/L (ref 19–32)
Calcium: 9.8 mg/dL (ref 8.4–10.5)
Chloride: 102 mEq/L (ref 96–112)
Creatinine, Ser: 1.11 mg/dL (ref 0.40–1.50)
GFR: 94.68 mL/min (ref >60.00)
GLUCOSE: 96 mg/dL (ref 70–99)
Potassium: 4.1 mEq/L (ref 3.5–5.1)
Sodium: 137 mEq/L (ref 135–145)

## 2014-11-11 ENCOUNTER — Encounter: Payer: Self-pay | Admitting: Family Medicine

## 2014-11-11 ENCOUNTER — Ambulatory Visit (INDEPENDENT_AMBULATORY_CARE_PROVIDER_SITE_OTHER): Payer: BC Managed Care – PPO | Admitting: Family Medicine

## 2014-11-11 VITALS — BP 124/86 | HR 80 | Temp 97.9°F | Ht 76.0 in | Wt 238.5 lb

## 2014-11-11 DIAGNOSIS — Z1211 Encounter for screening for malignant neoplasm of colon: Secondary | ICD-10-CM | POA: Insufficient documentation

## 2014-11-11 DIAGNOSIS — Z0000 Encounter for general adult medical examination without abnormal findings: Secondary | ICD-10-CM | POA: Diagnosis not present

## 2014-11-11 DIAGNOSIS — E786 Lipoprotein deficiency: Secondary | ICD-10-CM

## 2014-11-11 DIAGNOSIS — J302 Other seasonal allergic rhinitis: Secondary | ICD-10-CM

## 2014-11-11 NOTE — Assessment & Plan Note (Signed)
Actually great control today - will remove from list.

## 2014-11-11 NOTE — Progress Notes (Signed)
BP 124/86 mmHg  Pulse 80  Temp(Src) 97.9 F (36.6 C) (Oral)  Ht 6\' 4"  (1.93 m)  Wt 238 lb 8 oz (108.183 kg)  BMI 29.04 kg/m2   CC: CPE  Subjective:    Patient ID: Ricky Watkins, male    DOB: Feb 02, 1976, 39 y.o.   MRN: [id number]  HPI: HODGES TREIBER is a 39 y.o. male presenting on 11/11/2014 for Annual Exam   Preventative: Td 2003, may have had Tdap last year for city of [place].  Seat belt use discussed No changing moles on skin.  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  Relevant past medical, surgical, family and social history reviewed and updated as indicated. Interim medical history since our last visit reviewed. Allergies and medications reviewed and updated. Current Outpatient Prescriptions on File Prior to Visit  Medication Sig  . cetirizine (ZYRTEC) 10 MG tablet Take 10 mg by mouth daily.    . mometasone (NASONEX) 50 MCG/ACT nasal spray Place 1 spray into the nose daily as needed (allergies). Each nostril   No current facility-administered medications on file prior to visit.    Review of Systems  Constitutional: Negative for fever, chills, activity change, appetite change, fatigue and unexpected weight change.  HENT: Negative for hearing loss.   Eyes: Negative for visual disturbance.  Respiratory: Negative for cough, chest tightness, shortness of breath and wheezing.   Cardiovascular: Negative for chest pain, palpitations and leg swelling.  Gastrointestinal: Negative for nausea, vomiting, abdominal pain, diarrhea, constipation, blood in stool and abdominal distention.  Genitourinary: Negative for hematuria and difficulty urinating.  Musculoskeletal: Negative for myalgias, arthralgias and neck pain.  Skin: Negative for rash.  Neurological: Negative for dizziness, seizures, syncope and headaches.    Hematological: Negative for adenopathy. Does not bruise/bleed easily.  Psychiatric/Behavioral: Negative for dysphoric mood. The patient is not nervous/anxious.    Per HPI unless specifically indicated above     Objective:    BP 124/86 mmHg  Pulse 80  Temp(Src) 97.9 F (36.6 C) (Oral)  Ht 6\' 4"  (1.93 m)  Wt 238 lb 8 oz (108.183 kg)  BMI 29.04 kg/m2  Wt Readings from Last 3 Encounters:  11/11/14 238 lb 8 oz (108.183 kg)  09/14/14 234 lb 8 oz (106.369 kg)  07/14/13 229 lb 4.8 oz (104.01 kg)    Physical Exam  Constitutional: He is oriented to person, place, and time. He appears well-developed and well-nourished. No distress.  HENT:  Head: Normocephalic and atraumatic.  Right Ear: Hearing, tympanic membrane, external ear and ear canal normal.  Left Ear: Hearing, tympanic membrane, external ear and ear canal normal.  Nose: Nose normal.  Mouth/Throat: Uvula is midline, oropharynx is clear and moist and mucous membranes are normal. No oropharyngeal exudate, posterior oropharyngeal edema or posterior oropharyngeal erythema.  Eyes: Conjunctivae and EOM are normal. Pupils are equal, round, and reactive to light. No scleral icterus.  Neck: Normal range of motion. Neck supple. No thyromegaly present.  Cardiovascular: Normal rate, regular rhythm, normal heart sounds and intact distal pulses.   No murmur heard. Pulses:      Radial pulses are 2+ on the right side, and 2+ on the left side.  Pulmonary/Chest: Effort normal and breath sounds normal. No respiratory distress. He has no wheezes. He has no rales.  Abdominal: Soft. Bowel sounds are normal. He exhibits no  distension and no mass. There is no tenderness. There is no rebound and no guarding.  Musculoskeletal: Normal range of motion. He exhibits no edema.  Lymphadenopathy:    He has no cervical adenopathy.  Neurological: He is alert and oriented to person, place, and time.  CN grossly intact, station and gait intact  Skin: Skin is warm  and dry. No rash noted.  Psychiatric: He has a normal mood and affect. His behavior is normal. Judgment and thought content normal.  Nursing note and vitals reviewed.  Results for orders placed or performed in visit on 11/09/14  Lipid panel  Result Value Ref Range   Cholesterol 119 0 - 200 mg/dL   Triglycerides 47.0 0.0 - 149.0 mg/dL   HDL 61.60 >39.00 mg/dL   VLDL 9.4 0.0 - 40.0 mg/dL   LDL Cholesterol 48 0 - 99 mg/dL   Total CHOL/HDL Ratio 2    NonHDL 45.99   Basic metabolic panel  Result Value Ref Range   Sodium 137 135 - 145 mEq/L   Potassium 4.1 3.5 - 5.1 mEq/L   Chloride 102 96 - 112 mEq/L   CO2 32 19 - 32 mEq/L   Glucose, Bld 96 70 - 99 mg/dL   BUN 13 6 - 23 mg/dL   Creatinine, Ser 1.11 0.40 - 1.50 mg/dL   Calcium 9.8 8.4 - 10.5 mg/dL   GFR 94.68 >60.00 mL/min      Assessment & Plan:   Problem List Items Addressed This Visit    Health maintenance examination - Primary    Preventative protocols reviewed and updated unless pt declined. Discussed healthy diet and lifestyle.       RESOLVED: Low HDL (under 40)    Actually great control today - will remove from list.      Seasonal allergic rhinitis    Stable on current regimen.          Follow up plan: Return in about 2 years (around 11/10/2016), or as needed, for annual exam, prior fasting for blood work.

## 2014-11-11 NOTE — Addendum Note (Signed)
Addended by: Ria Bush on: 11/11/2014 08:38 AM   Modules accepted: Level of Service, SmartSet

## 2014-11-11 NOTE — Assessment & Plan Note (Signed)
Preventative protocols reviewed and updated unless pt declined. Discussed healthy diet and lifestyle.  

## 2014-11-11 NOTE — Patient Instructions (Addendum)
Find out about last tetanus shot and let us know.  You are doing well today.  Return as needed or in 2 years for next physical.

## 2014-11-11 NOTE — Assessment & Plan Note (Signed)
Stable on current regimen   

## 2014-11-11 NOTE — Progress Notes (Signed)
Pre visit review using our clinic review tool, if applicable. No additional management support is needed unless otherwise documented below in the visit note. 

## 2015-06-04 ENCOUNTER — Ambulatory Visit: Payer: BC Managed Care – PPO | Admitting: Family Medicine

## 2015-08-23 ENCOUNTER — Ambulatory Visit (INDEPENDENT_AMBULATORY_CARE_PROVIDER_SITE_OTHER): Payer: BC Managed Care – PPO | Admitting: Family Medicine

## 2015-08-23 ENCOUNTER — Encounter: Payer: Self-pay | Admitting: Family Medicine

## 2015-08-23 VITALS — BP 124/92 | HR 76 | Temp 97.5°F | Wt 248.8 lb

## 2015-08-23 DIAGNOSIS — M79672 Pain in left foot: Secondary | ICD-10-CM

## 2015-08-23 DIAGNOSIS — Z23 Encounter for immunization: Secondary | ICD-10-CM | POA: Diagnosis not present

## 2015-08-23 NOTE — Patient Instructions (Signed)
I think you have poor foot mechanics leading to your left foot pain, also possible bunion. Pass up front to schedule appointment with Dr Lorelei Pont to discuss custom orthotics.

## 2015-08-23 NOTE — Progress Notes (Signed)
   BP 124/92 mmHg  Pulse 76  Temp(Src) 97.5 F (36.4 C) (Oral)  Wt 248 lb 12 oz (112.832 kg)   CC: L foot pain  Subjective:    Patient ID: Ricky Watkins, male    DOB: Jun 18, 1976, 40 y.o.   MRN: [id number]  HPI: Ricky Watkins is a 40 y.o. male presenting on 08/23/2015 for Foot Pain   Longstanding issues with feet worse with prolonged walking. Over last several months noticing more pain of left foot. Describes throbbing pain at distal sole into toes. No paresthesias, redness or swelling of joints. Denies inciting trauma/injury or falls.   Wearing boots helps, but he regularly uses dress shoes at work.   Does use insoles he bought at [employer].   Relevant past medical, surgical, family and social history reviewed and updated as indicated. Interim medical history since our last visit reviewed. Allergies and medications reviewed and updated. Current Outpatient Prescriptions on File Prior to Visit  Medication Sig  . cetirizine (ZYRTEC) 10 MG tablet Take 10 mg by mouth daily.    . mometasone (NASONEX) 50 MCG/ACT nasal spray Place 1 spray into the nose daily as needed (allergies). Each nostril   No current facility-administered medications on file prior to visit.    Review of Systems Per HPI unless specifically indicated in ROS section     Objective:    BP 124/92 mmHg  Pulse 76  Temp(Src) 97.5 F (36.4 C) (Oral)  Wt 248 lb 12 oz (112.832 kg)  Wt Readings from Last 3 Encounters:  08/23/15 248 lb 12 oz (112.832 kg)  11/11/14 238 lb 8 oz (108.183 kg)  09/14/14 234 lb 8 oz (106.369 kg)    Physical Exam  Constitutional: He appears well-developed and well-nourished. No distress.  Musculoskeletal: He exhibits no edema.  2+ DP bilaterally No pain with calcaneal squeeze or at base of 5th MT R hallux valgus deformity, R bunion present Mild L foot pain with palpation at transverse arch. 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 No rash noted.  Psychiatric: He has a normal mood and affect.  Nursing note and vitals reviewed.     Assessment & Plan:   Problem List Items Addressed This Visit    Left foot pain - Primary    Anticipate from poor foot mechanics + bunion. Discussed with patient as well as further eval options - podiatry vs SM referral - pt interested in discussing custom orthotics to address loss of longitudinal/transverse arches. Removed factory insole from L shoe (he was using 3 insoles total in that shoe).        Other Visit Diagnoses    Need for influenza vaccination        Relevant Orders    Flu Vaccine QUAD 36+ mos PF IM (Fluarix & Fluzone Quad PF)        Follow up plan: Return if symptoms worsen or fail to improve.

## 2015-08-23 NOTE — Progress Notes (Signed)
Pre visit review using our clinic review tool, if applicable. No additional management support is needed unless otherwise documented below in the visit note. 

## 2015-08-23 NOTE — Assessment & Plan Note (Addendum)
Anticipate from poor foot mechanics + bunion. Discussed with patient as well as further eval options - podiatry vs SM referral - pt interested in discussing custom orthotics to address loss of longitudinal/transverse arches. Removed factory insole from L shoe (he was using 3 insoles total in that shoe).

## 2015-09-06 ENCOUNTER — Ambulatory Visit (INDEPENDENT_AMBULATORY_CARE_PROVIDER_SITE_OTHER): Payer: BC Managed Care – PPO | Admitting: Family Medicine

## 2015-09-06 ENCOUNTER — Encounter: Payer: Self-pay | Admitting: Family Medicine

## 2015-09-06 VITALS — BP 120/90 | HR 70 | Temp 98.0°F | Ht 76.0 in | Wt 245.8 lb

## 2015-09-06 DIAGNOSIS — M7742 Metatarsalgia, left foot: Secondary | ICD-10-CM | POA: Diagnosis not present

## 2015-09-06 DIAGNOSIS — G5762 Lesion of plantar nerve, left lower limb: Secondary | ICD-10-CM | POA: Diagnosis not present

## 2015-09-06 DIAGNOSIS — M2010 Hallux valgus (acquired), unspecified foot: Secondary | ICD-10-CM | POA: Diagnosis not present

## 2015-09-06 MED ORDER — METHYLPREDNISOLONE ACETATE 40 MG/ML IJ SUSP
20.0000 mg | Freq: Once | INTRAMUSCULAR | Status: AC
  Administered 2015-09-06: 20 mg via INTRA_ARTICULAR

## 2015-09-06 NOTE — Progress Notes (Signed)
Dr. Frederico Hamman T. Nycholas Rayner, MD, [facility] [place], [address] Phone: [phone] Fax: [phone]  09/06/2015  Patient: Ricky Watkins, MRN: [id number], DOB: 1975/11/26, 40 y.o.  Primary Physician:  Ria Bush, MD   Chief Complaint  Patient presents with  . Foot Pain    Discuss Orthotics   Subjective:   Ricky Watkins is a 40 y.o. very pleasant male patient who presents with the following:  L foot pain: very nice gentleman who is having left-sided forefoot pain and has been ongoing now off and on for years.  Primarily his pain is in the metatarsal heads, and he can point to the region of to 3 as the worst area by a lot.  There is no numbness or tingling.  No prior trauma.  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  Currently using a full length orthotic combined with a 3/4 length orthotic which helps some.  Past Medical History, Surgical History, Social History, Family History, Problem List, Medications, and Allergies have been reviewed and updated if relevant.  Patient Active Problem List   Diagnosis Date Noted  . Left foot pain 08/23/2015  . Health maintenance examination 11/11/2014  . Seasonal allergic rhinitis 03/21/2007    Past Medical History  Diagnosis Date  . Allergic rhinitis   . History of herniated intervertebral disc     Past Surgical History  Procedure Laterality Date  . No past surgeries      Social History   Social History  . Marital Status: Married    Spouse Name: N/A  . Number of Children: N/A  . Years of Education: N/A   Occupational History  . Not on file.   Social History Main Topics  . Smoking status: Never Smoker   . Smokeless tobacco: Never Used  . Alcohol Use: Yes     Comment: rarley  . Drug Use: No  . Sexual Activity: Not on file    Other Topics Concern  . 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    Family History  Problem Relation Age of Onset  . Cancer Father 63    prostate  . Cancer Paternal Aunt     breast  . Stroke Paternal Uncle   . Hypertension Neg Hx   . Diabetes Neg Hx   . CAD Neg Hx     No Known Allergies  Medication list reviewed and updated in full in [place].  GEN: No fevers, chills. Nontoxic. Primarily MSK c/o today. MSK: Detailed in the HPI GI: tolerating PO intake without difficulty Neuro: No numbness, parasthesias, or tingling associated. Otherwise the pertinent positives of the ROS are noted above.   Objective:   BP 120/90 mmHg  Pulse 70  Temp(Src) 98 F (36.7 C) (Oral)  Ht 6\' 4"  (1.93 m)  Wt 245 lb 12 oz (111.471 kg)  BMI 29.93 kg/m2   GEN: WDWN, NAD, Non-toxic, Alert & Oriented x 3 HEENT: Atraumatic, Normocephalic.  Ears and Nose: No external deformity. EXTR: No clubbing/cyanosis/edema NEURO: Normal gait.  PSYCH: Normally interactive. Conversant. Not depressed or anxious appearing.  Calm demeanor.   FEET: L Echymosis: no Edema: no ROM: full LE B Gait: heel toe, non-antalgic MT pain: YES BETWEEN 2 AND 3 Callus pattern: UNDER 2 AND ADJACENT TO BUNION Lateral Mall: NT Medial Mall: NT Talus: NT Navicular: NT Cuboid: NT Calcaneous: NT Metatarsals: NT 5th MT: NT Phalanges: NT Achilles: NT Plantar Fascia: NT Fat Pad: NT Peroneals: NT Post Tib: NT Great Toe: Nml motion Ant Drawer: neg ATFL: NT CFL: NT Deltoid: NT Other foot breakdown: none Long arch: MOD BREAKDOWN Transverse arch: DIFFUSE BREAKDOWN Hindfoot breakdown: none Sensation: intact   Radiology: No results found.  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 I am optimistic that if we can shrink via injection, he can get some relief.   My foot-tech / fast-tech orthotic system is not ideal for dress shoes. I have given him some ideas for better OTC orthotics.   Morton's Neuroma Injection, L, 2-3 Verbal consent was obtained. Risks (including rare risk of infection, potential skin bleaching or atrophy), benefits, and alternatives were discussed. Potential complications including loss of pigment and atrophy were discussed. Prepped with Chloraprep and Ethyl Chloride used for anesthesia. Under sterile conditions, the patient was injected at the point of maximal tenderness proximal to the web space between the metatarsal heads with 1/2 cc of Lidocaine 1% and Depo-Medrol 20 mg. Decreased pain after injection. No complications.    Patient Instructions  Over the counter orthotics  Either Everlean Alstrom or [employer]undio Miu (recently purchased by [employer])  Try at the [facility] on [address] in [place]     Follow-up: prn  Signed,  Frederico Hamman T. Shandelle Borrelli, MD   Patient's Medications  New Prescriptions   No medications on file  Previous Medications   CETIRIZINE (ZYRTEC) 10 MG TABLET    Take 10 mg by mouth daily.     MOMETASONE (NASONEX) 50 MCG/ACT NASAL SPRAY    Place 1 spray into the nose daily as needed (allergies). Each nostril  Modified Medications   No medications on file  Discontinued Medications   No medications on file

## 2015-09-06 NOTE — Progress Notes (Signed)
Pre visit review using our clinic review tool, if applicable. No additional management support is needed unless otherwise documented below in the visit note. 

## 2015-09-06 NOTE — Patient Instructions (Signed)
Over the counter orthotics  Either [employer] or [employer] (recently purchased by [employer])  Try at the [facility] on [address] in [place]

## 2015-09-17 ENCOUNTER — Other Ambulatory Visit: Payer: Self-pay | Admitting: Family Medicine

## 2016-01-30 ENCOUNTER — Ambulatory Visit (HOSPITAL_COMMUNITY)
Admission: EM | Admit: 2016-01-30 | Discharge: 2016-01-30 | Disposition: A | Discharge status: Home / Self Care | Payer: BC Managed Care – PPO | Attending: Emergency Medicine | Admitting: Emergency Medicine

## 2016-01-30 ENCOUNTER — Encounter (HOSPITAL_COMMUNITY): Payer: Self-pay | Admitting: *Deleted

## 2016-01-30 DIAGNOSIS — J069 Acute upper respiratory infection, unspecified: Secondary | ICD-10-CM | POA: Diagnosis not present

## 2016-01-30 MED ORDER — METHYLPREDNISOLONE 4 MG PO TBPK: ORAL_TABLET | ORAL | 0 refills | Status: DC

## 2016-01-30 NOTE — ED Provider Notes (Signed)
CSN: [id number]     Arrival date & time 01/30/16  1201 History   None    Chief Complaint  Patient presents with  . URI   (Consider location/radiation/quality/duration/timing/severity/associated sxs/prior Treatment) Patient presents with cough and pain with respiration X 2 days X. Condition is acute in nature. Condition is made better by nothing. Condition is made worse by nothing. Patient states that he has recently recovered from a cold and is concerned that he may get bronchitis. Patient states that he "feels like I am wheezing with pain when I breath" Patient  denies any fevers.  Patient denies any treatments prior to there arrival at this facility.        Past Medical History:  Diagnosis Date  . Allergic rhinitis   . History of herniated intervertebral disc    Past Surgical History:  Procedure Laterality Date  . NO PAST SURGERIES     Family History  Problem Relation Age of Onset  . Cancer Father 17    prostate  . Cancer Paternal Aunt     breast  . Stroke Paternal Uncle   . Hypertension Neg Hx   . Diabetes Neg Hx   . CAD Neg Hx    Social History  Substance Use Topics  . Smoking status: Never Smoker  . Smokeless tobacco: Never Used  . Alcohol use Yes     Comment: rarley    Review of Systems  Constitutional: Negative.   HENT: Negative.   Respiratory: Positive for cough and shortness of breath.     Allergies  Review of patient's allergies indicates no known allergies.  Home Medications   Prior to Admission medications   Medication Sig Start Date End Date Taking? Authorizing Provider  cetirizine (ZYRTEC) 10 MG tablet Take 10 mg by mouth daily.      Historical Provider, MD  methylPREDNISolone (MEDROL DOSEPAK) 4 MG TBPK tablet Take as directed for cough 01/30/16   Jacqualine Mau, NP  NASONEX 50 MCG/ACT nasal spray PLACE 1 SPRAY INTO THE NOSE DAILY AS NEEDED (ALLERGIES). EACH NOSTRIL 09/17/15   Ria Bush, MD   Meds Ordered and Administered this Visit   Medications - No data to display  BP 143/91 (BP Location: Right Arm)   Pulse 74   Temp 97.4 F (36.3 C) (Oral)   SpO2 96%  No data found.   Physical Exam  Constitutional: He is oriented to person, place, and time. He appears well-developed and well-nourished.  HENT:  Head: Normocephalic.  Eyes: Pupils are equal, round, and reactive to light.  Neck: Normal range of motion.  Cardiovascular: Normal rate and regular rhythm.   Pulmonary/Chest: Effort normal. He has wheezes (lower right lobe).  Neurological: He is alert and oriented to person, place, and time.    Urgent Care Course   Clinical Course    Procedures (including critical care time)  Labs Review Labs Reviewed - No data to display  Imaging Review No results found.   Visual Acuity Review  Right Eye Distance:   Left Eye Distance:   Bilateral Distance:    Right Eye Near:   Left Eye Near:    Bilateral Near:         MDM   1. URI (upper respiratory infection)       Jacqualine Mau, NP 01/30/16 1248

## 2016-01-30 NOTE — Discharge Instructions (Signed)
Continue to take nasonex 2 sprays in each nostril 2 times a day and push fluids

## 2016-01-30 NOTE — ED Triage Notes (Signed)
Pt  Reports cough   Congestion   With  Sinus  Drainage     And  Nasal  stuffyness    For  Several  Days      Pt reports  Symptoms  Not  releived  By otc meds   Sitting upright on the  Exam table  Speaking in  Complete  sentances

## 2016-02-23 ENCOUNTER — Other Ambulatory Visit: Payer: Self-pay | Admitting: Family Medicine

## 2016-02-23 ENCOUNTER — Other Ambulatory Visit (INDEPENDENT_AMBULATORY_CARE_PROVIDER_SITE_OTHER): Payer: BC Managed Care – PPO

## 2016-02-23 DIAGNOSIS — Z131 Encounter for screening for diabetes mellitus: Secondary | ICD-10-CM

## 2016-02-23 LAB — BASIC METABOLIC PANEL
BUN: 10 mg/dL (ref 6–23)
CALCIUM: 9 mg/dL (ref 8.4–10.5)
CO2: 32 mEq/L (ref 19–32)
CREATININE: 1.11 mg/dL (ref 0.40–1.50)
Chloride: 103 mEq/L (ref 96–112)
GFR: 94.06 mL/min (ref >60.00)
GLUCOSE: 102 mg/dL — AB (ref 70–99)
POTASSIUM: 4.1 meq/L (ref 3.5–5.1)
Sodium: 140 mEq/L (ref 135–145)

## 2016-02-29 ENCOUNTER — Encounter: Payer: BC Managed Care – PPO | Admitting: Family Medicine

## 2016-03-02 ENCOUNTER — Telehealth: Payer: Self-pay | Admitting: Family Medicine

## 2016-03-02 ENCOUNTER — Other Ambulatory Visit: Payer: Self-pay | Admitting: Family Medicine

## 2016-03-02 NOTE — Telephone Encounter (Signed)
Route to PCP

## 2016-03-02 NOTE — Telephone Encounter (Signed)
-----   Message from Marchia Bond sent at 02/23/2016 10:23 AM EDT ----- Regarding: 3 mo f/u labs Thurs 9/7, need orders. Thanks! :-) Please order future 3 mo f/u labs for pt's upcoming lab appt. Thanks Aniceto Boss

## 2016-03-02 NOTE — Telephone Encounter (Signed)
Pt had labs done 02/23/2016. Thanks.

## 2016-03-02 NOTE — Telephone Encounter (Signed)
error 

## 2016-03-09 ENCOUNTER — Encounter: Payer: Self-pay | Admitting: Family Medicine

## 2016-03-09 ENCOUNTER — Ambulatory Visit (INDEPENDENT_AMBULATORY_CARE_PROVIDER_SITE_OTHER): Payer: BC Managed Care – PPO | Admitting: Family Medicine

## 2016-03-09 VITALS — BP 118/82 | HR 84 | Temp 98.0°F | Ht 76.0 in | Wt 245.5 lb

## 2016-03-09 DIAGNOSIS — R5383 Other fatigue: Secondary | ICD-10-CM | POA: Insufficient documentation

## 2016-03-09 DIAGNOSIS — M79672 Pain in left foot: Secondary | ICD-10-CM

## 2016-03-09 DIAGNOSIS — R21 Rash and other nonspecific skin eruption: Secondary | ICD-10-CM

## 2016-03-09 DIAGNOSIS — Z23 Encounter for immunization: Secondary | ICD-10-CM

## 2016-03-09 DIAGNOSIS — G44219 Episodic tension-type headache, not intractable: Secondary | ICD-10-CM

## 2016-03-09 DIAGNOSIS — L989 Disorder of the skin and subcutaneous tissue, unspecified: Secondary | ICD-10-CM | POA: Insufficient documentation

## 2016-03-09 DIAGNOSIS — R51 Headache: Secondary | ICD-10-CM

## 2016-03-09 DIAGNOSIS — Z0000 Encounter for general adult medical examination without abnormal findings: Secondary | ICD-10-CM

## 2016-03-09 DIAGNOSIS — R5382 Chronic fatigue, unspecified: Secondary | ICD-10-CM

## 2016-03-09 DIAGNOSIS — R519 Headache, unspecified: Secondary | ICD-10-CM | POA: Insufficient documentation

## 2016-03-09 MED ORDER — TRIAMCINOLONE ACETONIDE 0.1 % EX CREA: 1.0000 "application " | TOPICAL_CREAM | Freq: Two times a day (BID) | CUTANEOUS | 0 refills | Status: AC

## 2016-03-09 NOTE — Assessment & Plan Note (Signed)
Describes tension type headache possibly related to increased work stress vs diet/sleep related. Discussed this. Suggested gentle neck stretching, heating pad, and keep headache diary. Update with effect, consider muscle relaxant. Ok to continue advil at this time PRN.

## 2016-03-09 NOTE — Progress Notes (Signed)
BP 118/82   Pulse 84   Temp 98 F (36.7 C) (Oral)   Ht 6\' 4"  (1.93 m)   Wt 245 lb 8 oz (111.4 kg)   BMI 29.88 kg/m    CC: CPE Subjective:    Patient ID: Ricky Watkins, male    DOB: 1975/06/28, 40 y.o.   MRN: [id number]  HPI: Ricky Watkins is a 40 y.o. 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   Ongoing and worsening headaches over last month. HA described as unilateral throbbing pounding. Improved with advil for 2-3 hours then recurs. Last HA was 2d ago. Increased stress at work recently. Caffeine about 1 cup/day. No nausea, photo/phonophobia, not activity limiting. + associated with neck muscle spasm/tightness.   Increased exhaustion present as well. Works out 5d/wk at gym - not able to get energy to do this, has decreased to 2x/wk.   No fevers/chills, viral URI sxs, congestion, cough., ST, tick bites, mosquito bites   Denies depression, anhedonia, sadness. Libido is fine. Recent vision check.  Noticing increased trouble sleeping as well due to thinking about work. No snoring. No apnea or PNdyspnea. No significant daytime somnolence.   Intermittent skin rash R neck - seems to come on after mowing lawn, pruritic and when he scratches it seems to spread.   Preventative: Flu shot yearly Td 2003, may have had Tdap last year for city of [place].  Seat belt use discussed Sunscreen use discussed. No changing moles on skin.  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  Relevant past medical, surgical, family and social history reviewed and updated as indicated. Interim medical history since our last visit reviewed. Allergies and medications reviewed and updated. Current Outpatient Prescriptions on File Prior to Visit  Medication Sig  . cetirizine (ZYRTEC) 10  MG tablet Take 10 mg by mouth daily.    Marland Kitchen NASONEX 50 MCG/ACT nasal spray PLACE 1 SPRAY INTO THE NOSE DAILY AS NEEDED (ALLERGIES). EACH NOSTRIL   No current facility-administered medications on file prior to visit.     Review of Systems  Constitutional: Positive for fatigue. Negative for activity change, appetite change, chills, fever and unexpected weight change.  HENT: Negative for hearing loss.   Eyes: Negative for visual disturbance.  Respiratory: Positive for cough. Negative for chest tightness, shortness of breath and wheezing.   Cardiovascular: Negative for chest pain, palpitations and leg swelling.  Gastrointestinal: Negative for abdominal distention, abdominal pain, blood in stool, constipation, diarrhea, nausea and vomiting.  Genitourinary: Negative for difficulty urinating and hematuria.  Musculoskeletal: Negative for arthralgias, myalgias and neck pain.  Skin: Negative for rash.  Neurological: Positive for dizziness (mild) and headaches. Negative for seizures and syncope.  Hematological: Negative for adenopathy. Does not bruise/bleed easily.  Psychiatric/Behavioral: Negative for dysphoric mood. The patient is not nervous/anxious.    Per HPI unless specifically indicated in ROS section     Objective:    BP 118/82   Pulse 84   Temp 98 F (36.7 C) (Oral)   Ht 6\' 4"  (1.93 m)   Wt 245 lb 8 oz (111.4 kg)   BMI 29.88 kg/m   Wt Readings from Last 3 Encounters:  03/09/16 245 lb 8 oz (111.4 kg)  09/06/15 245 lb 12 oz (111.5 kg)  08/23/15 248 lb 12 oz (  112.8 kg)    Physical Exam  Constitutional: He is oriented to person, place, and time. He appears well-developed and well-nourished. No distress.  HENT:  Head: Normocephalic and atraumatic.  Right Ear: Hearing, tympanic membrane, external ear and ear canal normal.  Left Ear: Hearing, tympanic membrane, external ear and ear canal normal.  Nose: Nose normal.  Mouth/Throat: Uvula is midline, oropharynx is clear and moist and  mucous membranes are normal. No oropharyngeal exudate, posterior oropharyngeal edema or posterior oropharyngeal erythema.  Eyes: Conjunctivae and EOM are normal. Pupils are equal, round, and reactive to light. No scleral icterus.  Neck: Normal range of motion. Neck supple. No thyromegaly present.  Cardiovascular: Normal rate, regular rhythm, normal heart sounds and intact distal pulses.   No murmur heard. Pulses:      Radial pulses are 2+ on the right side, and 2+ on the left side.  Pulmonary/Chest: Effort normal and breath sounds normal. No respiratory distress. He has no wheezes. He has no rales.  Abdominal: Soft. Bowel sounds are normal. He exhibits no distension and no mass. There is no tenderness. There is no rebound and no guarding.  Musculoskeletal: Normal range of motion. He exhibits no edema.  Lymphadenopathy:    He has no cervical adenopathy.  Neurological: He is alert and oriented to person, place, and time.  CN grossly intact, station and gait intact  Skin: Skin is warm and dry. Rash (pruritic coalescent papular rash R neck) noted.  Psychiatric: He has a normal mood and affect. His behavior is normal. Judgment and thought content normal.  Nursing note and vitals reviewed.  Results for orders placed or performed in visit on [id number]  Basic metabolic panel  Result Value Ref Range   Sodium 140 135 - 145 mEq/L   Potassium 4.1 3.5 - 5.1 mEq/L   Chloride 103 96 - 112 mEq/L   CO2 32 19 - 32 mEq/L   Glucose, Bld 102 (H) 70 - 99 mg/dL   BUN 10 6 - 23 mg/dL   Creatinine, Ser 1.11 0.40 - 1.50 mg/dL   Calcium 9.0 8.4 - 10.5 mg/dL   GFR 94.06 >60.00 mL/min      Assessment & Plan:   Problem List Items Addressed This Visit    Fatigue    Over last 1 month. ?related to deteriorated healthy diet/lifestyle. Discussed sleep and diet relation to energy levels. rec trial melatonin or benadryl OTC PRN, update if no better after 1-2 wks to return for fatigue evaluation (CBC, TSH, B12, D).         Headache    Describes tension type headache possibly related to increased work stress vs diet/sleep related. Discussed this. Suggested gentle neck stretching, heating pad, and keep headache diary. Update with effect, consider muscle relaxant. Ok to continue advil at this time PRN.       Health maintenance examination - Primary    Preventative protocols reviewed and updated unless pt declined. Discussed healthy diet and lifestyle.       Left foot pain   Rash and nonspecific skin eruption    R lateral neck skin rash comes and goes, chronic. Anticipate steroid responsive irritant or contact dermatitis. Will treat with TCI cream.        Other Visit Diagnoses    Need for influenza vaccination       Relevant Orders   Flu Vaccine QUAD 36+ mos PF IM (Fluarix & Fluzone Quad PF) (Completed)       Follow up plan: Return in  about 1 year (around 03/09/2017), or as needed, for annual exam, prior fasting for blood work.  Ria Bush, MD

## 2016-03-09 NOTE — Patient Instructions (Addendum)
Flu shot today Try triamcinolone cream for right neck rash - should help it clear quicker.  Try melatonin 3-6 mg for sleep or benadryl for sleep to help. Work on returning to prior exercise regimen. If no improvement, return for labs only to check for other reversible causes of fatigue.   Health Maintenance, Male A healthy lifestyle and preventative care can promote health and wellness.  Maintain regular health, dental, and eye exams.  Eat a healthy diet. Foods like vegetables, fruits, whole grains, low-fat dairy products, and lean protein foods contain the nutrients you need and are low in calories. Decrease your intake of foods high in solid fats, added sugars, and salt. Get information about a proper diet from your health care provider, if necessary.  Regular physical exercise is one of the most important things you can do for your health. Most adults should get at least 150 minutes of moderate-intensity exercise (any activity that increases your heart rate and causes you to sweat) each week. In addition, most adults need muscle-strengthening exercises on 2 or more days a week.   Maintain a healthy weight. The body mass index (BMI) is a screening tool to identify possible weight problems. It provides an estimate of body fat based on height and weight. Your health care provider can find your BMI and can help you achieve or maintain a healthy weight. For males 20 years and older:  A BMI below 18.5 is considered underweight.  A BMI of 18.5 to 24.9 is normal.  A BMI of 25 to 29.9 is considered overweight.  A BMI of 30 and above is considered obese.  Maintain normal blood lipids and cholesterol by exercising and minimizing your intake of saturated fat. Eat a balanced diet with plenty of fruits and vegetables. Blood tests for lipids and cholesterol should begin at age 19 and be repeated every 5 years. If your lipid or cholesterol levels are high, you are over age 34, or you are at high risk for  heart disease, you may need your cholesterol levels checked more frequently.Ongoing high lipid and cholesterol levels should be treated with medicines if diet and exercise are not working.  If you smoke, find out from your health care provider how to quit. If you do not use tobacco, do not start.  Lung cancer screening is recommended for adults aged 28-80 years who are at high risk for developing lung cancer because of a history of smoking. A yearly low-dose CT scan of the lungs is recommended for people who have at least a 30-pack-year history of smoking and are current smokers or have quit within the past 15 years. A pack year of smoking is smoking an average of 1 pack of cigarettes a day for 1 year (for example, a 30-pack-year history of smoking could mean smoking 1 pack a day for 30 years or 2 packs a day for 15 years). Yearly screening should continue until the smoker has stopped smoking for at least 15 years. Yearly screening should be stopped for people who develop a health problem that would prevent them from having lung cancer treatment.  If you choose to drink alcohol, do not have more than 2 drinks per day. One drink is considered to be 12 oz (360 mL) of beer, 5 oz (150 mL) of wine, or 1.5 oz (45 mL) of liquor.  Avoid the use of street drugs. Do not share needles with anyone. Ask for help if you need support or instructions about stopping the use of  drugs.  High blood pressure causes heart disease and increases the risk of stroke. High blood pressure is more likely to develop in:  People who have blood pressure in the end of the normal range (100-139/85-89 mm Hg).  People who are overweight or obese.  People who are African American.  If you are 56-63 years of age, have your blood pressure checked every 3-5 years. If you are 68 years of age or older, have your blood pressure checked every year. You should have your blood pressure measured twice--once when you are at a hospital or  clinic, and once when you are not at a hospital or clinic. Record the average of the two measurements. To check your blood pressure when you are not at a hospital or clinic, you can use:  An automated blood pressure machine at a pharmacy.  A home blood pressure monitor.  If you are 67-69 years old, ask your health care provider if you should take aspirin to prevent heart disease.  Diabetes screening involves taking a blood sample to check your fasting blood sugar level. This should be done once every 3 years after age 65 if you are at a normal weight and without risk factors for diabetes. Testing should be considered at a younger age or be carried out more frequently if you are overweight and have at least 1 risk factor for diabetes.  Colorectal cancer can be detected and often prevented. Most routine colorectal cancer screening begins at the age of 82 and continues through age 97. However, your health care provider may recommend screening at an earlier age if you have risk factors for colon cancer. On a yearly basis, your health care provider may provide home test kits to check for hidden blood in the stool. A small camera at the end of a tube may be used to directly examine the colon (sigmoidoscopy or colonoscopy) to detect the earliest forms of colorectal cancer. Talk to your health care provider about this at age 63 when routine screening begins. A direct exam of the colon should be repeated every 5-10 years through age 65, unless early forms of precancerous polyps or small growths are found.  People who are at an increased risk for hepatitis B should be screened for this virus. You are considered at high risk for hepatitis B if:  You were born in a country where hepatitis B occurs often. Talk with your health care provider about which countries are considered high risk.  Your parents were born in a high-risk country and you have not received a shot to protect against hepatitis B (hepatitis B  vaccine).  You have HIV or AIDS.  You use needles to inject street drugs.  You live with, or have sex with, someone who has hepatitis B.  You are a man who has sex with other men (MSM).  You get hemodialysis treatment.  You take certain medicines for conditions like cancer, organ transplantation, and autoimmune conditions.  Hepatitis C blood testing is recommended for all people born from [birth year] through 1965 and any individual with known risk factors for hepatitis C.  Healthy men should no longer receive prostate-specific antigen (PSA) blood tests as part of routine cancer screening. Talk to your health care provider about prostate cancer screening.  Testicular cancer screening is not recommended for adolescents or adult males who have no symptoms. Screening includes self-exam, a health care provider exam, and other screening tests. Consult with your health care provider about any symptoms you have  or any concerns you have about testicular cancer.  Practice safe sex. Use condoms and avoid high-risk sexual practices to reduce the spread of sexually transmitted infections (STIs).  You should be screened for STIs, including gonorrhea and chlamydia if:  You are sexually active and are younger than 24 years.  You are older than 24 years, and your health care provider tells you that you are at risk for this type of infection.  Your sexual activity has changed since you were last screened, and you are at an increased risk for chlamydia or gonorrhea. Ask your health care provider if you are at risk.  If you are at risk of being infected with HIV, it is recommended that you take a prescription medicine daily to prevent HIV infection. This is called pre-exposure prophylaxis (PrEP). You are considered at risk if:  You are a man who has sex with other men (MSM).  You are a heterosexual man who is sexually active with multiple partners.  You take drugs by injection.  You are sexually active  with a partner who has HIV.  Talk with your health care provider about whether you are at high risk of being infected with HIV. If you choose to begin PrEP, you should first be tested for HIV. You should then be tested every 3 months for as long as you are taking PrEP.  Use sunscreen. Apply sunscreen liberally and repeatedly throughout the day. You should seek shade when your shadow is shorter than you. Protect yourself by wearing long sleeves, pants, a wide-brimmed hat, and sunglasses year round whenever you are outdoors.  Tell your health care provider of new moles or changes in moles, especially if there is a change in shape or color. Also, tell your health care provider if a mole is larger than the size of a pencil eraser.  A one-time screening for abdominal aortic aneurysm (AAA) and surgical repair of large AAAs by ultrasound is recommended for men aged 43-75 years who are current or former smokers.  Stay current with your vaccines (immunizations).   This information is not intended to replace advice given to you by your health care provider. Make sure you discuss any questions you have with your health care provider.   Document Released: 12/09/2007 Document Revised: 07/03/2014 Document Reviewed: 11/07/2010 Elsevier Interactive Patient Education [employer].

## 2016-03-09 NOTE — Assessment & Plan Note (Addendum)
R lateral neck skin rash comes and goes, chronic. Anticipate steroid responsive irritant or contact dermatitis. Will treat with TCI cream.

## 2016-03-09 NOTE — Assessment & Plan Note (Signed)
Preventative protocols reviewed and updated unless pt declined. Discussed healthy diet and lifestyle.  

## 2016-03-09 NOTE — Assessment & Plan Note (Signed)
Over last 1 month. ?related to deteriorated healthy diet/lifestyle. Discussed sleep and diet relation to energy levels. rec trial melatonin or benadryl OTC PRN, update if no better after 1-2 wks to return for fatigue evaluation (CBC, TSH, B12, D).

## 2016-05-15 ENCOUNTER — Encounter (HOSPITAL_COMMUNITY): Payer: Self-pay

## 2016-05-15 ENCOUNTER — Emergency Department (HOSPITAL_COMMUNITY)
Admission: EM | Admit: 2016-05-15 | Discharge: 2016-05-15 | Disposition: A | Discharge status: Home / Self Care | Payer: BC Managed Care – PPO | Attending: Emergency Medicine | Admitting: Emergency Medicine

## 2016-05-15 DIAGNOSIS — Y999 Unspecified external cause status: Secondary | ICD-10-CM | POA: Diagnosis not present

## 2016-05-15 DIAGNOSIS — S161XXA Strain of muscle, fascia and tendon at neck level, initial encounter: Secondary | ICD-10-CM

## 2016-05-15 DIAGNOSIS — Y92410 Unspecified street and highway as the place of occurrence of the external cause: Secondary | ICD-10-CM | POA: Diagnosis not present

## 2016-05-15 DIAGNOSIS — S39012A Strain of muscle, fascia and tendon of lower back, initial encounter: Secondary | ICD-10-CM | POA: Diagnosis not present

## 2016-05-15 DIAGNOSIS — S169XXA Unspecified injury of muscle, fascia and tendon at neck level, initial encounter: Secondary | ICD-10-CM | POA: Diagnosis present

## 2016-05-15 DIAGNOSIS — Y939 Activity, unspecified: Secondary | ICD-10-CM | POA: Diagnosis not present
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
## 2016-06-30 ENCOUNTER — Ambulatory Visit: Payer: BC Managed Care – PPO | Admitting: Family Medicine

## 2016-07-04 ENCOUNTER — Ambulatory Visit (INDEPENDENT_AMBULATORY_CARE_PROVIDER_SITE_OTHER): Payer: BC Managed Care – PPO | Admitting: Family Medicine

## 2016-07-04 ENCOUNTER — Encounter: Payer: Self-pay | Admitting: Family Medicine

## 2016-07-04 VITALS — BP 118/82 | HR 76 | Temp 98.6°F | Wt 250.2 lb

## 2016-07-04 DIAGNOSIS — R5382 Chronic fatigue, unspecified: Secondary | ICD-10-CM

## 2016-07-04 DIAGNOSIS — M25562 Pain in left knee: Secondary | ICD-10-CM | POA: Insufficient documentation

## 2016-07-04 NOTE — Progress Notes (Signed)
Pre visit review using our clinic review tool, if applicable. No additional management support is needed unless otherwise documented below in the visit note. 

## 2016-07-04 NOTE — Progress Notes (Signed)
   BP 118/82   Pulse 76   Temp 98.6 F (37 C) (Oral)   Wt 250 lb 4 oz (113.5 kg)   BMI 30.46 kg/m    CC: L knee pain Subjective:    Patient ID: Ricky Watkins, male    DOB: July 31, 1975, 41 y.o.   MRN: [id number]  HPI: Ricky Watkins is a 41 y.o. male presenting on 07/04/2016 for Knee Pain (left, tired all the time)   Ongoing L knee pain over last 6 months. Started noticing while walking uphill cutting grass. Feels sudden sharp pain associated with instability. Progressively worsening. However, continues walking, working out every day without knee pain (eliptical or rower, weight lifting). Has decreased treadmill training due to knee. Does well with squats with weights. No locking in place. 1.5 yrs ago knee hyperextension injury. No knee swelling/redness/warmth.   Morton's neuroma with metatarsalgia s/p injection by Dr Lorelei Pont 08/2015.   Ongoing fatigue over last 4 months - despite regular exercise regimen.  Denies depression.  Normal sex drive.   Relevant past medical, surgical, family and social history reviewed and updated as indicated. Interim medical history since our last visit reviewed. Allergies and medications reviewed and updated. Current Outpatient Prescriptions on File Prior to Visit  Medication Sig  . cetirizine (ZYRTEC) 10 MG tablet Take 10 mg by mouth daily.    Marland Kitchen NASONEX 50 MCG/ACT nasal spray PLACE 1 SPRAY INTO THE NOSE DAILY AS NEEDED (ALLERGIES). EACH NOSTRIL  . triamcinolone cream (KENALOG) 0.1 % Apply 1 application topically 2 (two) times daily. Apply to AA.   No current facility-administered medications on file prior to visit.     Review of Systems Per HPI unless specifically indicated in ROS section     Objective:    BP 118/82   Pulse 76   Temp 98.6 F (37 C) (Oral)   Wt 250 lb 4 oz (113.5 kg)   BMI 30.46 kg/m   Wt Readings from Last 3 Encounters:  07/04/16 250 lb 4 oz (113.5 kg)  03/09/16 245 lb 8 oz (111.4 kg)  09/06/15 245 lb 12 oz  (111.5 kg)    Physical Exam  Constitutional: He appears well-developed and well-nourished. No distress.  Musculoskeletal: He exhibits no edema.  R knee WNL L knee exam: No deformity on inspection. No pain with palpation of knee landmarks. No effusion/swelling noted. FROM in flex/extension without crepitus. No popliteal fullness. Neg drawer test. Neg mcmurray test. No pain with valgus/varus stress. No PFgrind. No abnormal patellar mobility.   Skin: Skin is warm and dry. No rash noted.  Psychiatric: He has a normal mood and affect.  Nursing note and vitals reviewed.     Assessment & Plan:   Problem List Items Addressed This Visit    Fatigue    Check further labs if ongoing into next CPE (Vit B12, D, TSH, CBC)      Left lateral knee pain - Primary    Anticipate meniscal irritation however no reproducible pain to palpation. rec supportive care of knee brace with inciting activities and lateral leg raises to strengthen VMO. Update if not improving with treatment.           Follow up plan: Return if symptoms worsen or fail to improve.  Ria Bush, MD

## 2016-07-04 NOTE — Patient Instructions (Signed)
I think this is meniscal irritation - treat with knee brace when doing activities that worsen pain, and do leg raises with foot deviated laterally.  Let us know if ongoing trouble.

## 2016-07-04 NOTE — Assessment & Plan Note (Signed)
Check further labs if ongoing into next CPE (Vit B12, D, TSH, CBC)

## 2016-07-04 NOTE — Assessment & Plan Note (Signed)
Anticipate meniscal irritation however no reproducible pain to palpation. rec supportive care of knee brace with inciting activities and lateral leg raises to strengthen VMO. Update if not improving with treatment.

## 2016-11-25 ENCOUNTER — Other Ambulatory Visit: Payer: Self-pay | Admitting: Family Medicine

## 2017-03-15 ENCOUNTER — Ambulatory Visit (INDEPENDENT_AMBULATORY_CARE_PROVIDER_SITE_OTHER): Payer: BC Managed Care – PPO | Admitting: Family Medicine

## 2017-03-15 ENCOUNTER — Encounter: Payer: Self-pay | Admitting: Family Medicine

## 2017-03-15 DIAGNOSIS — L989 Disorder of the skin and subcutaneous tissue, unspecified: Secondary | ICD-10-CM

## 2017-03-15 NOTE — Progress Notes (Signed)
Spot on the scalp noted for the last week or so.  Occ will get puffy, sometimes bigger than others.  Can be sensitive to the touch most of the time, can vary some.  No drainage.  No trauma.  No trigger.  No FCNAVD.  About the size of a quarter, occ gets "taller" but not wider.  Hasn't been red.  He has some HA attributed to long work hours recently but that predates him noting the spot.    Meds, vitals, and allergies reviewed.   ROS: Per HPI unless specifically indicated in ROS section   nad ncat TM wnl Nasal and OP exam wnl PERRL CN 2-12 wnl B, S/S/DTR wnl x4 Neck supple, no LA Scalp with small flat minimally palpable non-irritated lesion near the midline on the top of the head. This looks like a very small flat sebaceous cyst. It does not look irritated or infected.

## 2017-03-15 NOTE — Patient Instructions (Signed)
Likely a small benign sebaceous cyst.  We can drain it if it gets a lot bigger or painful.  If needed, put warm compresses on the area.   Take care.  Glad to see you.

## 2017-03-18 NOTE — Assessment & Plan Note (Signed)
This looks like a very small flat sebaceous cyst. It does not look irritated or infected. Discussed with patient about options. He did not want incision and drainage. It likely would not need incision and drainage at this point. He'll monitor it. Update me as needed. He does have some headaches but these predate the lesion and are worsened with his long hours related to his work schedule. I think that the totally separate issue.

## 2017-04-19 ENCOUNTER — Emergency Department (HOSPITAL_COMMUNITY)
Admission: EM | Admit: 2017-04-19 | Discharge: 2017-04-19 | Disposition: A | Discharge status: Home / Self Care | Payer: BC Managed Care – PPO | Attending: Emergency Medicine | Admitting: Emergency Medicine

## 2017-04-19 ENCOUNTER — Emergency Department (HOSPITAL_COMMUNITY): Payer: BC Managed Care – PPO

## 2017-04-19 ENCOUNTER — Encounter (HOSPITAL_COMMUNITY): Payer: Self-pay | Admitting: *Deleted

## 2017-04-19 DIAGNOSIS — R220 Localized swelling, mass and lump, head: Secondary | ICD-10-CM | POA: Insufficient documentation

## 2017-04-19 DIAGNOSIS — G4452 New daily persistent headache (NDPH): Secondary | ICD-10-CM | POA: Diagnosis not present

## 2017-04-19 DIAGNOSIS — R519 Headache, unspecified: Secondary | ICD-10-CM

## 2017-04-19 DIAGNOSIS — R51 Headache: Secondary | ICD-10-CM | POA: Diagnosis present

## 2017-04-19 NOTE — ED Notes (Signed)
Pt transported back to room from radiology via wheelchair with tech, tolerated well.

## 2017-04-19 NOTE — ED Provider Notes (Signed)
[facility] EMER[facility]ENCY DEPARTMENT Provider Note   CSN: [id number] Arrival date & time: 04/19/17  0944     History   Chief Complaint Chief Complaint  Patient presents with  . Headache    HPI Ricky Watkins is a 41 y.o. male.  HPI    Ricky Watkins is a 41 y.o. male, with a history of allergic rhinitis, presenting to the ED with headache persistent for the last month. Patient states the headache is there when he goes to bed at night and there when he wakes up. Pain is bilateral parietal, described as a constant "droning," 2/10, nonradiating.  States this is different from his normal headaches. He usually has a headache a couple times a month, however, Advil will relieve the headache and it does not come back.  Patient also notes an incident where he had an increase in his headache while on an airplane during the descent back [place] his home airport.  The headache went back to the baseline headache described above upon landing.  This did not occur on the flight out nor did occur with ascent.  Patient also notes 2 areas of tenderness and intermittent swelling to the central parietal scalp that seem to come and go over the last month.  He has been evaluated by his PCP for this.  Denies fever/chills, congestion, dizziness, vision changes, neck pain/stiffness, neuro deficits, night sweats, nausea/vomiting, arthralgias/myalgias, rashes, or any other complaints.  Past Medical History:  Diagnosis Date  . Allergic rhinitis   . History of herniated intervertebral disc     Patient Active Problem List   Diagnosis Date Noted  . Left lateral knee pain 07/04/2016  . Headache 03/09/2016  . Fatigue 03/09/2016  . Skin lesion 03/09/2016  . Left foot pain 08/23/2015  . Health maintenance examination 11/11/2014  . Seasonal allergic rhinitis 03/21/2007    Past Surgical History:  Procedure Laterality Date  . NO PAST SURGERIES         Home Medications    Prior  to Admission medications   Medication Sig Start Date End Date Taking? Authorizing Provider  cetirizine (ZYRTEC) 10 MG tablet Take 10 mg by mouth daily as needed.     [provider]  NASONEX 50 MCG/ACT nasal spray PLACE 1 SPRAY INTO THE NOSE DAILY AS NEEDED (ALLERGIES). EACH NOSTRIL 09/17/15   Ria Bush, MD    Family History Family History  Problem Relation Age of Onset  . Cancer Father 78       prostate  . Cancer Paternal Aunt        breast  . Stroke Paternal Uncle   . Hypertension Neg Hx   . Diabetes Neg Hx   . CAD Neg Hx     Social History Social History  Substance Use Topics  . Smoking status: Never Smoker  . Smokeless tobacco: Never Used  . Alcohol use Yes     Comment: rarley     Allergies   Patient has no known allergies.   Review of Systems Review of Systems  Constitutional: Negative for chills, diaphoresis and fever.  HENT: Negative for congestion.   Eyes: Negative for photophobia and visual disturbance.  Respiratory: Negative for shortness of breath.   Cardiovascular: Negative for chest pain.  Gastrointestinal: Negative for abdominal pain, nausea and vomiting.  Musculoskeletal: Negative for neck pain and neck stiffness.  Skin:       Tender areas to scalp  Neurological: Positive for headaches. Negative for dizziness, seizures, syncope,  weakness, light-headedness and numbness.  All other systems reviewed and are negative.    Physical Exam Updated Vital Signs BP (!) 140/96 (BP Location: Left Arm)   Pulse 74   Temp 98.2 F (36.8 C) (Oral)   Resp 16   Ht 6\' 4"  (1.93 m)   Wt 108.9 kg (240 lb)   SpO2 97%   BMI 29.21 kg/m   Physical Exam  Constitutional: He is oriented to person, place, and time. He appears well-developed and well-nourished. No distress.  HENT:  Head: Normocephalic and atraumatic.  Mouth/Throat: Oropharynx is clear and moist.  Eyes: Pupils are equal, round, and reactive to light. Conjunctivae and EOM are normal.    Neck: Normal range of motion. Neck supple.  Cardiovascular: Normal rate, regular rhythm, normal heart sounds and intact distal pulses.   Pulmonary/Chest: Effort normal and breath sounds normal. No respiratory distress.  Abdominal: Soft. There is no tenderness. There is no guarding.  Musculoskeletal: He exhibits no edema.  Normal motor function intact in all extremities and spine. No midline spinal tenderness.   Lymphadenopathy:    He has no cervical adenopathy.  Neurological: He is alert and oriented to person, place, and time.  No sensory deficits.  No noted speech deficits. No aphasia. Patient handles oral secretions without difficulty. No noted swallowing defects.  Equal grip strength bilaterally. Strength 5/5 in the upper extremities. Strength 5/5 with flexion and extension of the hips, knees, and ankles bilaterally.  Patellar DTRs 2+ bilaterally. Negative Romberg. No gait disturbance.  Coordination intact including heel to shin and finger to nose.  Cranial nerves III-XII grossly intact.  No facial droop.   Skin: Skin is warm and dry. Capillary refill takes less than 2 seconds. He is not diaphoretic.  2 areas of tenderness to the central parietal scalp.  Patient shaves his head and thus these areas are well visualized.  One area of what appears to be minor swelling measuring about 2 x 2 cm and circular.  Area is firm to the touch.  No noted fluctuance, increased warmth, erythema, or drainage.  Psychiatric: He has a normal mood and affect. His behavior is normal.  Nursing note and vitals reviewed.    ED Treatments / Results  Labs (all labs ordered are listed, but only abnormal results are displayed) Labs Reviewed - No data to display  EKG  EKG Interpretation None       Radiology Ct Head Wo Contrast  Result Date: 04/19/2017 CLINICAL DATA:  Acute headache for 30 days, normal neurological exam EXAM: CT HEAD WITHOUT CONTRAST TECHNIQUE: Contiguous axial images were obtained  from the base of the skull through the vertex without intravenous contrast. Sagittal and coronal MPR images reconstructed from axial data set. COMPARISON:  None FINDINGS: Brain: Normal ventricular morphology. No midline shift or mass effect. Normal appearance of brain parenchyma. No intracranial hemorrhage, mass lesion, evidence of acute infarction, or extra-axial fluid collection. Vascular: Normal appearance Skull: Normal appearance Sinuses/Orbits: Clear Other: N/A IMPRESSION: Normal exam. Electronically Signed   By: Lavonia Dana M.D.   On: 04/19/2017 14:10    Procedures [place] bedside Date/Time: 04/19/2017 12:45 PM Performed by: Lorayne Bender Authorized by: Arlean Hopping C  Consent: Verbal consent obtained. Risks and benefits: risks, benefits and alternatives were discussed Consent given by: patient Patient identity confirmed: verbally with patient and provided demographic data Local anesthesia used: no  Anesthesia: Local anesthesia used: no  Sedation: Patient sedated: no Patient tolerance: Patient tolerated the procedure well with no immediate  complications Comments: Superficial ultrasound performed on areas of tenderness.  No noted fluid collection or cellulitis.  No area of suspected abscess.    (including critical care time)    Medications Ordered in ED Medications - No data to display   Initial Impression / Assessment and Plan / ED Course  I have reviewed the triage vital signs and the nursing notes.  Pertinent labs & imaging results that were available during my care of the patient were reviewed by me and considered in my medical decision making (see chart for details).      Patient presents with a persistent headache.  Ultrasound at bedside reveals no area that I believe would benefit from bedside I&D. CT without acute abnormality.  PCP and neurology follow-up. The patient was given instructions for home care as well as return precautions. Patient voices understanding of these  instructions, accepts the plan, and is comfortable with discharge.   Findings and plan of care discussed with Lajean Saver, MD.   Final Clinical Impressions(s) / ED Diagnoses   Final diagnoses:  Persistent headaches    New Prescriptions Discharge Medication List as of 04/19/2017  2:43 PM       Lorayne Bender, PA-C 04/20/17 1258    Lajean Saver, MD 04/20/17 1416

## 2017-04-19 NOTE — Discharge Instructions (Signed)
There were no acute abnormalities on the head CT.  May use the pain management regimen below. Antiinflammatory medications: Take 600 mg of ibuprofen every 6 hours or 440 mg (over the counter dose) to 500 mg (prescription dose) of naproxen every 12 hours for the next 3 days. After this time, these medications may be used as needed for pain. Take these medications with food to avoid upset stomach. Choose only one of these medications, do not take them together. Tylenol: Should you continue to have additional pain while taking the ibuprofen or naproxen, you may add in tylenol as needed. Your daily total maximum amount of tylenol from all sources should be limited to 4000mg /day for persons without liver problems, or 2000mg /day for those with liver problems.  Follow-up with your primary care provider or the neurologist for further workup.  Return to the ED for significantly worsening pain, continued vomiting, persistent fever, persistent dizziness, vision abnormalities, or any other major concerns.

## 2017-04-19 NOTE — ED Triage Notes (Signed)
To ED for eval of HA. States he has noticed 2 bumps to top of his head. Noticed approx 1 month ago. HAs accompanied these areas. States he knew the pain was there but just went on with his busy schedule. Last pm while on an airplane returning to [place] from [facility] his HA became worse while in air. States when landing he noticed the HA subsiding to his normal 2/10 HA. No vomiting. No aloc.

## 2017-04-19 NOTE — ED Notes (Signed)
Patient transported to CT via wheelchair with tech

## 2017-07-06 ENCOUNTER — Encounter: Payer: Self-pay | Admitting: Family Medicine

## 2017-07-06 ENCOUNTER — Encounter (INDEPENDENT_AMBULATORY_CARE_PROVIDER_SITE_OTHER): Payer: Self-pay

## 2017-07-06 ENCOUNTER — Ambulatory Visit: Payer: BC Managed Care – PPO | Admitting: Family Medicine

## 2017-07-06 VITALS — BP 124/82 | HR 90 | Temp 98.2°F | Wt 255.0 lb

## 2017-07-06 DIAGNOSIS — R5382 Chronic fatigue, unspecified: Secondary | ICD-10-CM

## 2017-07-06 DIAGNOSIS — Z23 Encounter for immunization: Secondary | ICD-10-CM | POA: Diagnosis not present

## 2017-07-06 NOTE — Addendum Note (Signed)
Addended by: Brenton Grills on: 08/24/6008 93:23 AM   Modules accepted: Orders

## 2017-07-06 NOTE — Assessment & Plan Note (Signed)
Ongoing over >60yr with significant decreased energy. His work schedule that makes it difficult to get regular sleep at times. Reviewed importance of good sleep regimen for fatigue.  ESS = 16 No mood trouble. No decreased libido.  Will return for 8am labs to eval for reversible causes of fatigue. If unrevealing, discussed possible referral for sleep apnea eval.

## 2017-07-06 NOTE — Patient Instructions (Addendum)
Work on regular sleep schedule.  Return for 8am labs one morning next week and we will be in touch with results.   Fatigue Fatigue is feeling tired all of the time, a lack of energy, or a lack of motivation. Occasional or mild fatigue is often a normal response to activity or life in general. However, long-lasting (chronic) or extreme fatigue may indicate an underlying medical condition. Follow these instructions at home: Watch your fatigue for any changes. The following actions may help to lessen any discomfort you are feeling:  Talk to your health care provider about how much sleep you need each night. Try to get the required amount every night.  Take medicines only as directed by your health care provider.  Eat a healthy and nutritious diet. Ask your health care provider if you need help changing your diet.  Drink enough fluid to keep your urine clear or pale yellow.  Practice ways of relaxing, such as yoga, meditation, massage therapy, or acupuncture.  Exercise regularly.  Change situations that cause you stress. Try to keep your work and personal routine reasonable.  Do not abuse illegal drugs.  Limit alcohol intake to no more than 1 drink per day for nonpregnant women and 2 drinks per day for men. One drink equals 12 ounces of beer, 5 ounces of wine, or 1 ounces of hard liquor.  Take a multivitamin, if directed by your health care provider.  Contact a health care provider if:  Your fatigue does not get better.  You have a fever.  You have unintentional weight loss or gain.  You have headaches.  You have difficulty: ? Falling asleep. ? Sleeping throughout the night.  You feel angry, guilty, anxious, or sad.  You are unable to have a bowel movement (constipation).  You skin is dry.  Your legs or another part of your body is swollen. Get help right away if:  You feel confused.  Your vision is blurry.  You feel faint or pass out.  You have a severe  headache.  You have severe abdominal, pelvic, or back pain.  You have chest pain, shortness of breath, or an irregular or fast heartbeat.  You are unable to urinate or you urinate less than normal.  You develop abnormal bleeding, such as bleeding from the rectum, vagina, nose, lungs, or nipples.  You vomit blood.  You have thoughts about harming yourself or committing suicide.  You are worried that you might harm someone else. This information is not intended to replace advice given to you by your health care provider. Make sure you discuss any questions you have with your health care provider. Document Released: 04/09/2007 Document Revised: 11/18/2015 Document Reviewed: 10/14/2013 Elsevier Interactive Patient Education  [employer].

## 2017-07-06 NOTE — Progress Notes (Signed)
BP 124/82 (BP Location: Left Arm, Patient Position: Sitting, Cuff Size: Large)   Pulse 90   Temp 98.2 F (36.8 C) (Oral)   Wt 255 lb (115.7 kg)   SpO2 95%   BMI 31.04 kg/m    CC: fatigue Subjective:    Patient ID: Ricky Watkins, male    DOB: [birth year], 42 y.o.   MRN: [id number]  HPI: Ricky Watkins is a 42 y.o. male presenting on 07/06/2017 for Fatigue (C/o exhaustion and feeling tired every afternoon. Noticed within this past yr. States he sleeps well but feels like he never "recharges".  Works out regularly.  Concerned about low testosterone.)   Ongoing fatigue over the past 1+ year.  Weight gain noted.  Feels well when he wakes up (but never restorative sleep), as day progresses he feels more tired, worn out. Bedtime tends to be 9pm. Mild snoring. + daytime sleepiness. No PNdyspnea or witnessed apnea.  Has needed to change work out routine to mornings because he can no longer tolerating exercise in afternoons.  Libido ok. No significant depression.  2-3 cups caffeine in the mornings.  Regular meals eats 6 small ones a day - doesn't skip.   Doesn't sleep in on weekends.  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  Planning to move into new position - with more regular schedule and routine.   Relevant past medical, surgical, family and social history reviewed and updated as indicated. Interim medical history since our last visit reviewed. Allergies and medications reviewed and updated. Outpatient Medications Prior to Visit  Medication Sig Dispense Refill  . cetirizine (ZYRTEC) 10 MG tablet Take 10 mg by mouth daily as needed.     Marland Kitchen NASONEX 50 MCG/ACT nasal spray PLACE 1 SPRAY INTO THE NOSE DAILY AS NEEDED (ALLERGIES). EACH NOSTRIL 17 g 6   No facility-administered medications prior to visit.      Per HPI unless specifically indicated in ROS section below Review of Systems     Objective:    BP 124/82 (BP Location: Left Arm, Patient Position: Sitting, Cuff Size:  Large)   Pulse 90   Temp 98.2 F (36.8 C) (Oral)   Wt 255 lb (115.7 kg)   SpO2 95%   BMI 31.04 kg/m   Wt Readings from Last 3 Encounters:  07/06/17 255 lb (115.7 kg)  04/19/17 240 lb (108.9 kg)  03/15/17 249 lb 9 oz (113.2 kg)    Physical Exam  Constitutional: He appears well-developed and well-nourished. No distress.  HENT:  Head: Normocephalic and atraumatic.  Mouth/Throat: Oropharynx is clear and moist. No oropharyngeal exudate.  Eyes: Conjunctivae and EOM are normal. Pupils are equal, round, and reactive to light. No scleral icterus.  Neck: Normal range of motion. Neck supple. No thyromegaly present.  Cardiovascular: Normal rate, regular rhythm, normal heart sounds and intact distal pulses.  No murmur heard. Pulmonary/Chest: Effort normal and breath sounds normal. No respiratory distress. He has no wheezes. He has no rales.  Abdominal: Soft. Bowel sounds are normal. He exhibits no distension. There is no hepatosplenomegaly. There is no tenderness.  Musculoskeletal: He exhibits no edema.  Lymphadenopathy:    He has no cervical adenopathy.  Skin: Skin is warm and dry. No rash noted.  Psychiatric: He has a normal mood and affect.  Nursing note and vitals reviewed.  Results for orders placed or performed in visit on 67/89/38  Basic metabolic panel  Result Value Ref Range   Sodium 140 135 - 145 mEq/L   Potassium  4.1 3.5 - 5.1 mEq/L   Chloride 103 96 - 112 mEq/L   CO2 32 19 - 32 mEq/L   Glucose, Bld 102 (H) 70 - 99 mg/dL   BUN 10 6 - 23 mg/dL   Creatinine, Ser 1.11 0.40 - 1.50 mg/dL   Calcium 9.0 8.4 - 10.5 mg/dL   GFR 94.06 >60.00 mL/min      Assessment & Plan:  Over 25 minutes were spent face-to-face with the patient during this encounter and >50% of that time was spent on counseling and coordination of care  Flu shot today Problem List Items Addressed This Visit    Fatigue - Primary    Ongoing over >72yr with significant decreased energy. His work schedule that makes  it difficult to get regular sleep at times. Reviewed importance of good sleep regimen for fatigue.  ESS = 16 No mood trouble. No decreased libido.  Will return for 8am labs to eval for reversible causes of fatigue. If unrevealing, discussed possible referral for sleep apnea eval.       Relevant Orders   Vitamin B12   Testosterone   VITAMIN D 25 Hydroxy (Vit-D Deficiency, Fractures)   Comprehensive metabolic panel   TSH   CBC with Differential/Platelet       Follow up plan: Return if symptoms worsen or fail to improve.  Ria Bush, MD

## 2017-07-09 ENCOUNTER — Other Ambulatory Visit (INDEPENDENT_AMBULATORY_CARE_PROVIDER_SITE_OTHER): Payer: BC Managed Care – PPO

## 2017-07-09 ENCOUNTER — Other Ambulatory Visit: Payer: Self-pay | Admitting: Family Medicine

## 2017-07-09 DIAGNOSIS — R5382 Chronic fatigue, unspecified: Secondary | ICD-10-CM

## 2017-07-09 LAB — CBC WITH DIFFERENTIAL/PLATELET
Basophils Absolute: 0 10*3/uL (ref 0.0–0.1)
Basophils Relative: 0.4 % (ref 0.0–3.0)
EOS PCT: 4.6 % (ref 0.0–5.0)
Eosinophils Absolute: 0.2 10*3/uL (ref 0.0–0.7)
HEMATOCRIT: 44.3 % (ref 39.0–52.0)
HEMOGLOBIN: 14.4 g/dL (ref 13.0–17.0)
LYMPHS PCT: 38.1 % (ref 12.0–46.0)
Lymphs Abs: 1.7 10*3/uL (ref 0.7–4.0)
MCHC: 32.5 g/dL (ref 30.0–36.0)
MCV: 82.7 fl (ref 78.0–100.0)
MONO ABS: 0.4 10*3/uL (ref 0.1–1.0)
MONOS PCT: 9.5 % (ref 3.0–12.0)
Neutro Abs: 2.1 10*3/uL (ref 1.4–7.7)
Neutrophils Relative %: 47.4 % (ref 43.0–77.0)
Platelets: 209 10*3/uL (ref 150.0–400.0)
RBC: 5.36 Mil/uL (ref 4.22–5.81)
RDW: 14.4 % (ref 11.5–15.5)
WBC: 4.4 10*3/uL (ref 4.0–10.5)

## 2017-07-09 LAB — COMPREHENSIVE METABOLIC PANEL
ALT: 19 U/L (ref 0–53)
AST: 19 U/L (ref 0–37)
Albumin: 4.3 g/dL (ref 3.5–5.2)
Alkaline Phosphatase: 52 U/L (ref 39–117)
BUN: 13 mg/dL (ref 6–23)
CHLORIDE: 103 meq/L (ref 96–112)
CO2: 30 meq/L (ref 19–32)
Calcium: 9.2 mg/dL (ref 8.4–10.5)
Creatinine, Ser: 1.1 mg/dL (ref 0.40–1.50)
GFR: 94.41 mL/min (ref >60.00)
GLUCOSE: 99 mg/dL (ref 70–99)
POTASSIUM: 3.9 meq/L (ref 3.5–5.1)
SODIUM: 140 meq/L (ref 135–145)
Total Bilirubin: 0.5 mg/dL (ref 0.2–1.2)
Total Protein: 6.9 g/dL (ref 6.0–8.3)

## 2017-07-09 LAB — VITAMIN B12: VITAMIN B 12: 356 pg/mL (ref 211–911)

## 2017-07-09 LAB — TSH: TSH: 1.12 u[IU]/mL (ref 0.35–4.50)

## 2017-07-09 LAB — VITAMIN D 25 HYDROXY (VIT D DEFICIENCY, FRACTURES): VITD: 21 ng/mL — ABNORMAL LOW (ref 30.00–100.00)

## 2017-07-09 LAB — TESTOSTERONE: Testosterone: 311.17 ng/dL (ref 300.00–890.00)

## 2017-08-15 ENCOUNTER — Encounter: Payer: Self-pay | Admitting: Pulmonary Disease

## 2017-08-15 ENCOUNTER — Ambulatory Visit: Payer: BC Managed Care – PPO | Admitting: Pulmonary Disease

## 2017-08-15 VITALS — BP 126/64 | HR 78 | Ht 76.0 in | Wt 251.0 lb

## 2017-08-15 DIAGNOSIS — R29818 Other symptoms and signs involving the nervous system: Secondary | ICD-10-CM

## 2017-08-15 DIAGNOSIS — G4733 Obstructive sleep apnea (adult) (pediatric): Secondary | ICD-10-CM

## 2017-08-15 NOTE — Patient Instructions (Signed)
Will arrange for in lab sleep study Will call to arrange for follow up after sleep study reviewed  

## 2017-08-15 NOTE — Progress Notes (Signed)
   Subjective:    Patient ID: Ricky Watkins, male    DOB: July 28, 1975, 42 y.o.   MRN: [id number]  HPI    Review of Systems  Constitutional: Negative for fever and unexpected weight change.  HENT: Negative for congestion, dental problem, ear pain, nosebleeds, postnasal drip, rhinorrhea, sinus pressure, sneezing, sore throat and trouble swallowing.   Eyes: Negative for redness and itching.  Respiratory: Negative for cough, chest tightness, shortness of breath and wheezing.   Cardiovascular: Negative for palpitations and leg swelling.  Gastrointestinal: Negative for nausea and vomiting.  Genitourinary: Negative for dysuria.  Musculoskeletal: Negative for joint swelling.  Skin: Negative for rash.  Allergic/Immunologic: Positive for environmental allergies. Negative for food allergies and immunocompromised state.  Neurological: Negative for headaches.  Hematological: Does not bruise/bleed easily.  Psychiatric/Behavioral: Negative for dysphoric mood. The patient is not nervous/anxious.        Objective:   Physical Exam        Assessment & Plan:

## 2017-08-15 NOTE — Progress Notes (Signed)
Ricky Watkins, Critical Care, and Sleep Medicine  Chief Complaint  Patient presents with  . sleep consult    Pt referred by Dr. Ria Bush MD. Pt sleeps 6-7 hrs each night and wakes up 1-2 each night. Pt wakes up fatigued and stays tired all day. Denies naps during the daytime.    Vital signs: BP 126/64 (BP Location: Left Arm, Cuff Size: Normal)   Pulse 78   Ht 6\' 4"  (1.93 m)   Wt 251 lb (113.9 kg)   SpO2 100%   BMI 30.55 kg/m   History of Present Illness: Ricky Watkins is a 42 y.o. male for evaluation of sleep problems.  He has noticed feeling more fatigued for the past year.  He used to have energy all day long.  Now he struggles in the afternoon.  He had to shift his workout routine to the morning because he was getting to sleepy and tired in the afternoon.    His wife says that he snores.  He will have to wake up a few times to use the bathroom at night.  He does get a dry mouth while asleep.  He doesn't remember dreaming much.    He goes to bed between 830 and 10 pm.  He falls asleep immediately.  He gets up at 5 am.  He needs to drink coffee in the morning to get going.  He gets his kids off to school, and then goes to workout.  He feels energized at this time, but this feeling isn't lasting like before.  He denies morning headaches.  He isn't using anything to help him sleep.  He denies sleep walking, sleep talking, bruxism, or nightmares.  There is no history of restless legs.  He denies sleep hallucinations, sleep paralysis, or cataplexy.  The Epworth score is 13 out of 24.  He was found to have low Vitamin D and B12 levels.  Other labs from 07/09/17 reviewed and unremarkable, including testosterone in low normal range.  Physical Exam:  General - pleasant Eyes - pupils reactive ENT - no sinus tenderness, no oral exudate, no LAN, scalloped tongue, MP 4 Cardiac - regular, no murmur Chest - no wheeze, rales Abd - soft, non tender Ext - no edema Skin - no  rashes Neuro - normal strength Psych - normal mood   CMP Latest Ref Rng & Units 07/09/2017 02/23/2016 11/09/2014  Glucose 70 - 99 mg/dL 99 102(H) 96  BUN 6 - 23 mg/dL 13 10 13   Creatinine 0.40 - 1.50 mg/dL 1.10 1.11 1.11  Sodium 135 - 145 mEq/L 140 140 137  Potassium 3.5 - 5.1 mEq/L 3.9 4.1 4.1  Chloride 96 - 112 mEq/L 103 103 102  CO2 19 - 32 mEq/L 30 32 32  Calcium 8.4 - 10.5 mg/dL 9.2 9.0 9.8  Total Protein 6.0 - 8.3 g/dL 6.9 - -  Total Bilirubin 0.2 - 1.2 mg/dL 0.5 - -  Alkaline Phos 39 - 117 U/L 52 - -  AST 0 - 37 U/L 19 - -  ALT 0 - 53 U/L 19 - -    Lab Results  Component Value Date   TSH 1.12 07/09/2017    CBC Latest Ref Rng & Units 07/09/2017 07/14/2013 12/24/2007  WBC 4.0 - 10.5 K/uL 4.4 6.4 4.5  Hemoglobin 13.0 - 17.0 g/dL 14.4 14.7 14.6  Hematocrit 39.0 - 52.0 % 44.3 43.5 43.1  Platelets 150.0 - 400.0 K/uL 209.0 187 206     Discussion: He has snoring,  sleep disruption and daytime sleepiness.  This is associated with progressive fatigue.  He has upper airway anatomy suggestive of risk for sleep apnea.  We discussed how sleep apnea can affect various health problems, including risks for hypertension, cardiovascular disease, and diabetes.  We also discussed how sleep disruption can increase risks for accidents, such as while driving.  Weight loss as a means of improving sleep apnea was also reviewed.  Additional treatment options discussed were CPAP therapy, oral appliance, and surgical intervention.  Assessment/Plan:  Suspected sleep apnea. - will arrange for in lab sleep study   Patient Instructions  Will arrange for in lab sleep study Will call to arrange for follow up after sleep study reviewed    Chesley Mires, MD [place] 08/15/2017, 9:38 AM Pager:  [phone]  Flow Sheet  Sleep tests:  Review of Systems: Constitutional: Negative for fever and unexpected weight change.  HENT: Negative for congestion, dental problem, ear pain,  nosebleeds, postnasal drip, rhinorrhea, sinus pressure, sneezing, sore throat and trouble swallowing.   Eyes: Negative for redness and itching.  Respiratory: Negative for cough, chest tightness, shortness of breath and wheezing.   Cardiovascular: Negative for palpitations and leg swelling.  Gastrointestinal: Negative for nausea and vomiting.  Genitourinary: Negative for dysuria.  Musculoskeletal: Negative for joint swelling.  Skin: Negative for rash.  Allergic/Immunologic: Positive for environmental allergies. Negative for food allergies and immunocompromised state.  Neurological: Negative for headaches.  Hematological: Does not bruise/bleed easily.  Psychiatric/Behavioral: Negative for dysphoric mood. The patient is not nervous/anxious.    Past Medical History: He  has a past medical history of Allergic rhinitis and History of herniated intervertebral disc.  Past Surgical History: He  has a past surgical history that includes No past surgeries.  Family History: His family history includes Cancer in his paternal aunt; Cancer (age of onset: 67) in his father; Stroke in his paternal uncle.  Social History: He  reports that  has never smoked. he has never used smokeless tobacco. He reports that he drinks alcohol. He reports that he does not use drugs.  Medications: Allergies as of 08/15/2017   No Known Allergies     Medication List        Accurate as of 08/15/17  9:38 AM. Always use your most recent med list.          cetirizine 10 MG tablet Commonly known as:  ZYRTEC Take 10 mg by mouth daily as needed.   NASONEX 50 MCG/ACT nasal spray Generic drug:  mometasone PLACE 1 SPRAY INTO THE NOSE DAILY AS NEEDED (ALLERGIES). EACH NOSTRIL

## 2017-08-27 ENCOUNTER — Encounter (HOSPITAL_BASED_OUTPATIENT_CLINIC_OR_DEPARTMENT_OTHER): Payer: BC Managed Care – PPO

## 2017-09-14 ENCOUNTER — Ambulatory Visit: Payer: BC Managed Care – PPO | Admitting: Family Medicine

## 2017-09-18 ENCOUNTER — Ambulatory Visit (HOSPITAL_BASED_OUTPATIENT_CLINIC_OR_DEPARTMENT_OTHER): Payer: BC Managed Care – PPO | Attending: Pulmonary Disease | Admitting: Pulmonary Disease

## 2017-09-18 VITALS — Ht 77.0 in | Wt 255.0 lb

## 2017-09-18 DIAGNOSIS — G4733 Obstructive sleep apnea (adult) (pediatric): Secondary | ICD-10-CM | POA: Diagnosis not present

## 2017-09-18 DIAGNOSIS — R0683 Snoring: Secondary | ICD-10-CM | POA: Diagnosis present

## 2017-09-18 DIAGNOSIS — R29818 Other symptoms and signs involving the nervous system: Secondary | ICD-10-CM

## 2017-09-18 DIAGNOSIS — G4736 Sleep related hypoventilation in conditions classified elsewhere: Secondary | ICD-10-CM | POA: Insufficient documentation

## 2017-09-18 DIAGNOSIS — R5383 Other fatigue: Secondary | ICD-10-CM | POA: Diagnosis present

## 2017-09-18 DIAGNOSIS — R400 Somnolence: Secondary | ICD-10-CM | POA: Diagnosis present

## 2017-09-26 ENCOUNTER — Telehealth: Payer: Self-pay | Admitting: Pulmonary Disease

## 2017-09-26 DIAGNOSIS — G4733 Obstructive sleep apnea (adult) (pediatric): Secondary | ICD-10-CM | POA: Insufficient documentation

## 2017-09-26 NOTE — Procedures (Signed)
   Patient Name: Ricky Watkins, Ricky Watkins Date: 09/18/2017 Gender: Male D.O.B: 09/02/1975 Age (years): 39 Referring Provider: Chesley Mires MD, ABSM Height (inches): 77 Interpreting Physician: Chesley Mires MD, ABSM Weight (lbs): 255 RPSGT: Laren Everts BMI: 30 MRN: [id number] Neck Size: 16.00  CLINICAL INFORMATION Sleep Study Type: NPSG  Indication for sleep study: Excessive Daytime Sleepiness, Fatigue  Epworth Sleepiness Score: 9  SLEEP STUDY TECHNIQUE As per the AASM Manual for the Scoring of Sleep and Associated Events v2.3 (April 2016) with a hypopnea requiring 4% desaturations.  The channels recorded and monitored were frontal, central and occipital EEG, electrooculogram (EOG), submentalis EMG (chin), nasal and oral airflow, thoracic and abdominal wall motion, anterior tibialis EMG, snore microphone, electrocardiogram, and pulse oximetry.  MEDICATIONS Medications self-administered by patient taken the night of the study : N/A  SLEEP ARCHITECTURE The study was initiated at 10:02:06 PM and ended at 4:48:06 AM.  Sleep onset time was 10.8 minutes and the sleep efficiency was 61.5%%. The total sleep time was 249.5 minutes.  Stage REM latency was 95.5 minutes.  The patient spent 14.8%% of the night in stage N1 sleep, 69.3%% in stage N2 sleep, 0.0%% in stage N3 and 15.83% in REM.  Alpha intrusion was absent.  Supine sleep was 17.23%.  RESPIRATORY PARAMETERS The overall apnea/hypopnea index (AHI) was 7.2 per hour. There were 6 total apneas, including 5 obstructive, 1 central and 0 mixed apneas. There were 24 hypopneas and 19 RERAs.  The AHI during Stage REM sleep was 30.4 per hour.  AHI while supine was 8.4 per hour.  The mean oxygen saturation was 93.5%. The minimum SpO2 during sleep was 87.0%.  moderate snoring was noted during this study.  CARDIAC DATA The 2 lead EKG demonstrated sinus rhythm. The mean heart rate was 71.5 beats per minute. Other EKG findings  include: None.  LEG MOVEMENT DATA The total PLMS were 0 with a resulting PLMS index of 0.0. Associated arousal with leg movement index was 0.0 .  IMPRESSIONS - Mild obstructive sleep apnea occurred during this study (AHI = 7.2/h). - No significant central sleep apnea occurred during this study (CAI = 0.2/h). - Mild oxygen desaturation was noted during this study (Min O2 = 87.0%). - The patient snored with moderate snoring volume. - No cardiac abnormalities were noted during this study. - Clinically significant periodic limb movements did not occur during sleep. No significant associated arousals.  DIAGNOSIS - Obstructive Sleep Apnea (327.23 [G47.33 ICD-10]) - Nocturnal Hypoxemia (327.26 [G47.36 ICD-10])  RECOMMENDATIONS - Additional therapies include weight loss, CPAP, oral appliance or surgical assessment.  [Electronically signed] 09/26/2017 01:06 PM  Chesley Mires MD, [place], American Board of Sleep Medicine NPI: [id number]

## 2017-09-26 NOTE — Telephone Encounter (Signed)
PSG 09/18/17 >> AHI 7.2, SpO2 low 87%.    Will have my nurse inform pt that sleep study shows mild sleep apnea.  Options are 1) CPAP now, 2) ROV first.  If He is agreeable to CPAP, then please send order for auto CPAP range 5 to 15 cm H2O with heated humidity and mask of choice.  Have download sent 1 month after starting CPAP and set up ROV 2 months after starting CPAP.  ROV can be with me or NP.

## 2017-09-26 NOTE — Telephone Encounter (Signed)
Left voice mail on machine for patient to return phone call back regarding results. X1  

## 2017-09-27 ENCOUNTER — Ambulatory Visit: Payer: BC Managed Care – PPO | Admitting: Family Medicine

## 2017-09-27 ENCOUNTER — Encounter: Payer: Self-pay | Admitting: Family Medicine

## 2017-09-27 VITALS — BP 122/78 | HR 83 | Temp 98.1°F | Wt 252.0 lb

## 2017-09-27 DIAGNOSIS — M76821 Posterior tibial tendinitis, right leg: Secondary | ICD-10-CM | POA: Insufficient documentation

## 2017-09-27 DIAGNOSIS — G4710 Hypersomnia, unspecified: Secondary | ICD-10-CM

## 2017-09-27 MED ORDER — DICLOFENAC SODIUM 1 % TD GEL: 1.0000 "application " | Freq: Three times a day (TID) | TRANSDERMAL | 1 refills | Status: DC

## 2017-09-27 NOTE — Patient Instructions (Addendum)
You have tendonitis of the medial foot - treat with ibuprofen 400-600mg  with meals for 1 week and topical anti inflammatory to tender area.  Rest foot, may use ice to area.  Let us know if not improving with treatment.

## 2017-09-27 NOTE — Assessment & Plan Note (Signed)
Story/exam most consistent with this. Treat with ibuprofen 400-600mg  TID with meals for 1 wk then PRN, as well as voltaren gel. Discussed rest, elevation, ice and provided with exercises from [facility] pt advisor. Pt agrees with plan, will update [place] if not improving with treatment.

## 2017-09-27 NOTE — Progress Notes (Addendum)
   BP 122/78 (BP Location: Left Arm, Patient Position: Sitting, Cuff Size: Large)   Pulse 83   Temp 98.1 F (36.7 C) (Oral)   Wt 252 lb (114.3 kg)   SpO2 97%   BMI 29.88 kg/m    CC: R ankle pain Subjective:    Patient ID: Ricky Watkins, male    DOB: July 11, 1975, 42 y.o.   MRN: [id number]  HPI: Ricky Watkins is a 42 y.o. male presenting on 09/27/2017 for Ankle Pain (Right ankle pain radiating up to the knee. Started about 1.5 wks ago. Denies injury. Tried Tylenol, not helpful. )   1.5 wk h/o R medial ankle pain as well as knee ache.  Denies inciting trauma/injury  Tylenol was not helpful.  Recent sleep study - mild OSA with desaturation to 87% at night - planned starting CPAP.   Relevant past medical, surgical, family and social history reviewed and updated as indicated. Interim medical history since our last visit reviewed. Allergies and medications reviewed and updated. Outpatient Medications Prior to Visit  Medication Sig Dispense Refill  . cetirizine (ZYRTEC) 10 MG tablet Take 10 mg by mouth daily as needed.     Marland Kitchen NASONEX 50 MCG/ACT nasal spray PLACE 1 SPRAY INTO THE NOSE DAILY AS NEEDED (ALLERGIES). EACH NOSTRIL 17 g 6   No facility-administered medications prior to visit.      Per HPI unless specifically indicated in ROS section below Review of Systems     Objective:    BP 122/78 (BP Location: Left Arm, Patient Position: Sitting, Cuff Size: Large)   Pulse 83   Temp 98.1 F (36.7 C) (Oral)   Wt 252 lb (114.3 kg)   SpO2 97%   BMI 29.88 kg/m   Wt Readings from Last 3 Encounters:  09/27/17 252 lb (114.3 kg)  09/18/17 255 lb (115.7 kg)  08/15/17 251 lb (113.9 kg)    Physical Exam  Constitutional: He appears well-developed and well-nourished. No distress.  Musculoskeletal: He exhibits edema (medial ankle pain/swelling).  2+ DP bilaterally Evident swelling at medial ankle on right with point tenderness along posterior tibial tendon, pain with resisted  dorisflexion/inversion  Bunion with hallux valgus deformity No ankle ligament laxity, neg calcaneal squeeze test, no pain at plantar fascia or at medial malleolus or at navicular  Neurological: He is alert.  Skin: Skin is warm and dry. No rash noted. No erythema.  Psychiatric: He has a normal mood and affect.  Nursing note and vitals reviewed.     Assessment & Plan:   Problem List Items Addressed This Visit    Posterior tibial tendonitis, right - Primary    Story/exam most consistent with this. Treat with ibuprofen 400-600mg  TID with meals for 1 wk then PRN, as well as voltaren gel. Discussed rest, elevation, ice and provided with exercises from [facility] pt advisor. Pt agrees with plan, will update Ricky Watkins if not improving with treatment.           Meds ordered this encounter  Medications  . diclofenac sodium (VOLTAREN) 1 % GEL    Sig: Apply 1 application topically 3 (three) times daily.    Dispense:  1 Tube    Refill:  1   No orders of the defined types were placed in this encounter.   Follow up plan: Return if symptoms worsen or fail to improve.  Ricky Bush, MD

## 2017-09-28 NOTE — Telephone Encounter (Signed)
LMTCB x2 on preferred phone number listed for patient. 

## 2017-10-09 NOTE — Telephone Encounter (Signed)
LMTCB x3. Letter will be mailed to patient to contact our office for his results.

## 2017-10-11 ENCOUNTER — Telehealth: Payer: Self-pay

## 2017-10-11 NOTE — Telephone Encounter (Signed)
Started PA for Voltaren 1% gel, key:  [id number], PA case ID:  [id number], Rx #:  [id number].  Decision pending.

## 2017-10-15 NOTE — Telephone Encounter (Signed)
Received PA approval effective 10/11/2017 through 10/11/2020.   Spoke with [facility] informing them of approval.

## 2017-10-16 ENCOUNTER — Telehealth: Payer: Self-pay | Admitting: Pulmonary Disease

## 2017-10-16 NOTE — Telephone Encounter (Signed)
(  copied from previous phone message)  PSG 09/18/17 >> AHI 7.2, SpO2 low 87%. Will have my nurse inform pt that sleep study shows mild sleep apnea.  Options are 1) CPAP now, 2) ROV first.  If He is agreeable to CPAP, then please send order for auto CPAP range 5 to 15 cm H2O with heated humidity and mask of choice.  Have download sent 1 month after starting CPAP and set up ROV 2 months after starting CPAP.  ROV can be with me or NP. ---------------- Spoke with pt, aware of results/recs.  Wants to discuss other options first.  Scheduled to see VS on Friday.  Nothing further needed at this time.

## 2017-10-19 ENCOUNTER — Ambulatory Visit: Payer: BC Managed Care – PPO | Admitting: Pulmonary Disease

## 2018-03-27 ENCOUNTER — Ambulatory Visit (INDEPENDENT_AMBULATORY_CARE_PROVIDER_SITE_OTHER): Payer: BC Managed Care – PPO | Admitting: Family Medicine

## 2018-03-27 ENCOUNTER — Encounter: Payer: Self-pay | Admitting: Family Medicine

## 2018-03-27 VITALS — BP 120/86 | HR 95 | Temp 98.0°F | Ht 76.0 in | Wt 258.5 lb

## 2018-03-27 DIAGNOSIS — Z23 Encounter for immunization: Secondary | ICD-10-CM

## 2018-03-27 DIAGNOSIS — K219 Gastro-esophageal reflux disease without esophagitis: Secondary | ICD-10-CM | POA: Diagnosis not present

## 2018-03-27 MED ORDER — OMEPRAZOLE 40 MG PO CPDR: 40.0000 mg | DELAYED_RELEASE_CAPSULE | Freq: Every day | ORAL | 2 refills | Status: DC

## 2018-03-27 NOTE — Patient Instructions (Addendum)
Flu shot today.  Take omeprazole 40mg  daily for 3 weeks then stop. Watch diet. Afterwards may take omeprazole as needed.  Head of bed elevated.  Avoidance of citrus, fatty foods, chocolate, peppermint, and excessive alcohol, along with sodas, orange juice (acidic drinks).  At least a few hours between dinner and bed, minimize naps after eating.  Gastroesophageal Reflux Disease, Adult Normally, food travels down the esophagus and stays in the stomach to be digested. If a person has gastroesophageal reflux disease (GERD), food and stomach acid move back up into the esophagus. When this happens, the esophagus becomes sore and swollen (inflamed). Over time, GERD can make small holes (ulcers) in the lining of the esophagus. Follow these instructions at home: Diet  Follow a diet as told by your doctor. You may need to avoid foods and drinks such as: ? Coffee and tea (with or without caffeine). ? Drinks that contain alcohol. ? Energy drinks and sports drinks. ? Carbonated drinks or sodas. ? Chocolate and cocoa. ? Peppermint and mint flavorings. ? Garlic and onions. ? Horseradish. ? Spicy and acidic foods, such as peppers, chili powder, curry powder, vinegar, hot sauces, and BBQ sauce. ? Citrus fruit juices and citrus fruits, such as oranges, lemons, and limes. ? Tomato-based foods, such as red sauce, chili, salsa, and pizza with red sauce. ? Fried and fatty foods, such as donuts, french fries, potato chips, and high-fat dressings. ? High-fat meats, such as hot dogs, rib eye steak, sausage, ham, and bacon. ? High-fat dairy items, such as whole milk, butter, and cream cheese.  Eat small meals often. Avoid eating large meals.  Avoid drinking large amounts of liquid with your meals.  Avoid eating meals during the 2-3 hours before bedtime.  Avoid lying down right after you eat.  Do not exercise right after you eat. General instructions  Pay attention to any changes in your symptoms.  Take  over-the-counter and prescription medicines only as told by your doctor. Do not take aspirin, ibuprofen, or other NSAIDs unless your doctor says it is okay.  Do not use any tobacco products, including cigarettes, chewing tobacco, and e-cigarettes. If you need help quitting, ask your doctor.  Wear loose clothes. Do not wear anything tight around your waist.  Raise (elevate) the head of your bed about 6 inches (15 cm).  Try to lower your stress. If you need help doing this, ask your doctor.  If you are overweight, lose an amount of weight that is healthy for you. Ask your doctor about a safe weight loss goal.  Keep all follow-up visits as told by your doctor. This is important. Contact a doctor if:  You have new symptoms.  You lose weight and you do not know why it is happening.  You have trouble swallowing, or it hurts to swallow.  You have wheezing or a cough that keeps happening.  Your symptoms do not get better with treatment.  You have a hoarse voice. Get help right away if:  You have pain in your arms, neck, jaw, teeth, or back.  You feel sweaty, dizzy, or light-headed.  You have chest pain or shortness of breath.  You throw up (vomit) and your throw up looks like blood or coffee grounds.  You pass out (faint).  Your poop (stool) is bloody or black.  You cannot swallow, drink, or eat. This information is not intended to replace advice given to you by your health care provider. Make sure you discuss any questions you have  with your health care provider. Document Released: 11/29/2007 Document Revised: 11/18/2015 Document Reviewed: 10/07/2014 Elsevier Interactive Patient Education  [employer].

## 2018-03-27 NOTE — Progress Notes (Signed)
BP 120/86 (BP Location: Left Arm, Patient Position: Sitting, Cuff Size: Large)   Pulse 95   Temp 98 F (36.7 C) (Oral)   Ht 6\' 4"  (1.93 m)   Wt 258 lb 8 oz (117.3 kg)   SpO2 95%   BMI 31.47 kg/m    CC: heartburn Subjective:    Patient ID: Ricky Watkins, male    DOB: [birth year], 42 y.o.   MRN: [id number]  HPI: Ricky Watkins is a 42 y.o. male presenting on 03/27/2018 for Heartburn (C/o heartburn for a few months. Uses OTC antacids daily.)   2+ month h/o worsening heartburn. Has been treating with frequent tums and rolaids.   Avoids sodas. Worsening to pizza, red sauce, spicy foods, OJ etc.  Drinks 3 cups coffee, sometimes 2 in evening Few mixed alcoholic drinks on weekend No significant NSAID use.   No abd pain, nausea/vomiting, dysphagia, unexpected weight loss.  Weight gain - more strength training recently.   Relevant past medical, surgical, family and social history reviewed and updated as indicated. Interim medical history since our last visit reviewed. Allergies and medications reviewed and updated. Outpatient Medications Prior to Visit  Medication Sig Dispense Refill  . cetirizine (ZYRTEC) 10 MG tablet Take 10 mg by mouth daily as needed.     Marland Kitchen NASONEX 50 MCG/ACT nasal spray PLACE 1 SPRAY INTO THE NOSE DAILY AS NEEDED (ALLERGIES). EACH NOSTRIL 17 g 6  . diclofenac sodium (VOLTAREN) 1 % GEL Apply 1 application topically 3 (three) times daily. 1 Tube 1   No facility-administered medications prior to visit.      Per HPI unless specifically indicated in ROS section below Review of Systems     Objective:    BP 120/86 (BP Location: Left Arm, Patient Position: Sitting, Cuff Size: Large)   Pulse 95   Temp 98 F (36.7 C) (Oral)   Ht 6\' 4"  (1.93 m)   Wt 258 lb 8 oz (117.3 kg)   SpO2 95%   BMI 31.47 kg/m   Wt Readings from Last 3 Encounters:  03/27/18 258 lb 8 oz (117.3 kg)  09/27/17 252 lb (114.3 kg)  09/18/17 255 lb (115.7 kg)    Physical Exam    Constitutional: He appears well-developed and well-nourished. No distress.  HENT:  Mouth/Throat: Oropharynx is clear and moist. No oropharyngeal exudate.  Cardiovascular: Normal rate, regular rhythm and normal heart sounds.  No murmur heard. Pulmonary/Chest: Effort normal and breath sounds normal. No respiratory distress. He has no wheezes. He has no rales.  Abdominal: Soft. Bowel sounds are normal. He exhibits no distension and no mass. There is no tenderness. There is no rebound and no guarding. No hernia.  Psychiatric: He has a normal mood and affect.  Nursing note and vitals reviewed.     Assessment & Plan:   Problem List Items Addressed This Visit    GERD (gastroesophageal reflux disease) - Primary    Uncomplicated GERD, no red flags. Rx omeprazole 40mg  daily x 3 wks, then PRN. GERD dietary and lifestyle precautions reviewed including decreasing caffeine and mints.  Update if not improving with treatment.       Relevant Medications   omeprazole (PRILOSEC) 40 MG capsule    Other Visit Diagnoses    Need for influenza vaccination       Relevant Orders   Flu Vaccine QUAD 36+ mos IM (Completed)       Meds ordered this encounter  Medications  . omeprazole (PRILOSEC) 40 MG capsule  Sig: Take 1 capsule (40 mg total) by mouth daily. Take for 3 wks then as needed    Dispense:  30 capsule    Refill:  2   Orders Placed This Encounter  Procedures  . Flu Vaccine QUAD 36+ mos IM    Follow up plan: Return if symptoms worsen or fail to improve.  Ria Bush, MD

## 2018-03-27 NOTE — Assessment & Plan Note (Signed)
Uncomplicated GERD, no red flags. Rx omeprazole 40mg  daily x 3 wks, then PRN. GERD dietary and lifestyle precautions reviewed including decreasing caffeine and mints.  Update if not improving with treatment.

## 2018-06-06 NOTE — Progress Notes (Signed)
BP 120/82 (BP Location: Left Arm, Patient Position: Sitting, Cuff Size: Large)   Pulse 93   Temp 98.6 F (37 C) (Oral)   Ht 6\' 4"  (1.93 m)   Wt 262 lb 8 oz (119.1 kg)   SpO2 94%   BMI 31.95 kg/m    CC: GERD f/u Subjective:    Patient ID: Ricky Watkins, male    DOB: [birth year], 42 y.o.   MRN: [id number]  HPI: Ricky Watkins is a 42 y.o. male presenting on 06/07/2018 for Gastroesophageal Reflux (Here for f/u. Flaring up again after switching from prescription to OTC med. )   See prior note for details - seen here 03/27/2018 with dx GERD exacerbation treated with omeprazole 40mg  daily x 3 wks then PRN. Symptoms improved while on PPI, but have recurred since switching to pepcid 10mg  daily. Notes substernal chest burning worse with stress.  He has changed diet - no sodas, drinks water consistently, only drinks juice on Fridays, cut down on fried foods.   Still no vomiting, dysphagia, weight loss, abd pain or other red flag symptoms.   Relevant past medical, surgical, family and social history reviewed and updated as indicated. Interim medical history since our last visit reviewed. Allergies and medications reviewed and updated. Outpatient Medications Prior to Visit  Medication Sig Dispense Refill  . cetirizine (ZYRTEC) 10 MG tablet Take 10 mg by mouth daily as needed.     . famotidine (PEPCID) 10 MG tablet Take 10 mg by mouth daily.    Marland Kitchen NASONEX 50 MCG/ACT nasal spray PLACE 1 SPRAY INTO THE NOSE DAILY AS NEEDED (ALLERGIES). EACH NOSTRIL 17 g 6  . omeprazole (PRILOSEC) 40 MG capsule Take 1 capsule (40 mg total) by mouth daily. Take for 3 wks then as needed 30 capsule 2   No facility-administered medications prior to visit.      Per HPI unless specifically indicated in ROS section below Review of Systems     Objective:    BP 120/82 (BP Location: Left Arm, Patient Position: Sitting, Cuff Size: Large)   Pulse 93   Temp 98.6 F (37 C) (Oral)   Ht 6\' 4"  (1.93 m)   Wt  262 lb 8 oz (119.1 kg)   SpO2 94%   BMI 31.95 kg/m   Wt Readings from Last 3 Encounters:  06/07/18 262 lb 8 oz (119.1 kg)  03/27/18 258 lb 8 oz (117.3 kg)  09/27/17 252 lb (114.3 kg)    Physical Exam Vitals signs and nursing note reviewed.  Constitutional:      Appearance: Normal appearance.  Neurological:     Mental Status: He is alert.  Psychiatric:        Mood and Affect: Mood normal.        Assessment & Plan:   Problem List Items Addressed This Visit    GERD (gastroesophageal reflux disease) - Primary    Uncomplicated GERD that responded to Rx PPI x3 wks, now not responding to famotidine 10mg  daily. Reviewed dietary and lifestyle changes to control symptoms. He has implemented several of these. rec omeprazole 20mg  daily x 2-3 months then retry famotidine.  Rx 40mg  omeprazole to have on hand if needed.       Relevant Medications   famotidine (PEPCID) 10 MG tablet   omeprazole (PRILOSEC) 40 MG capsule       Meds ordered this encounter  Medications  . omeprazole (PRILOSEC) 40 MG capsule    Sig: Take 1 capsule (40 mg total) by  mouth daily as needed (GERD symptoms).    Dispense:  30 capsule    Refill:  3   No orders of the defined types were placed in this encounter.   Follow up plan: Return if symptoms worsen or fail to improve.  Ria Bush, MD

## 2018-06-07 ENCOUNTER — Ambulatory Visit: Payer: BC Managed Care – PPO | Admitting: Family Medicine

## 2018-06-07 ENCOUNTER — Encounter: Payer: Self-pay | Admitting: Family Medicine

## 2018-06-07 VITALS — BP 120/82 | HR 93 | Temp 98.6°F | Ht 76.0 in | Wt 262.5 lb

## 2018-06-07 DIAGNOSIS — K219 Gastro-esophageal reflux disease without esophagitis: Secondary | ICD-10-CM

## 2018-06-07 MED ORDER — OMEPRAZOLE 40 MG PO CPDR: 40.0000 mg | DELAYED_RELEASE_CAPSULE | Freq: Every day | ORAL | 3 refills | Status: DC | PRN

## 2018-06-07 NOTE — Assessment & Plan Note (Addendum)
Uncomplicated GERD that responded to Rx PPI x3 wks, now not responding to famotidine 10mg  daily. Reviewed dietary and lifestyle changes to control symptoms. He has implemented several of these. rec omeprazole 20mg  daily x 2-3 months then retry famotidine.  Rx 40mg  omeprazole to have on hand if needed.

## 2018-06-07 NOTE — Patient Instructions (Signed)
Try over the counter strength omeprazole (prilosec OTC 20mg  daily). Try to take 30 min before the largest meal of the day.  If needed, may return to 40mg  prescription strength (refills sent to pharmacy).

## 2019-03-26 IMAGING — CT CT HEAD W/O CM
4 series · 15 of 47 positions shown, 17 images · non-contrast
Comparison: None

CLINICAL DATA: Acute headache for 30 days, normal neurological exam

EXAM:
CT HEAD WITHOUT CONTRAST
TECHNIQUE: Contiguous axial images were obtained from the base of the skull
through the vertex without intravenous contrast. Sagittal and
coronal MPR images reconstructed from axial data set.

[Series 3: head without · axial · non-contrast · 0.42mm/px · z∈[-40,+80]mm · 7 of 32 slices shown, 9 images]
[im 4/32  brain]
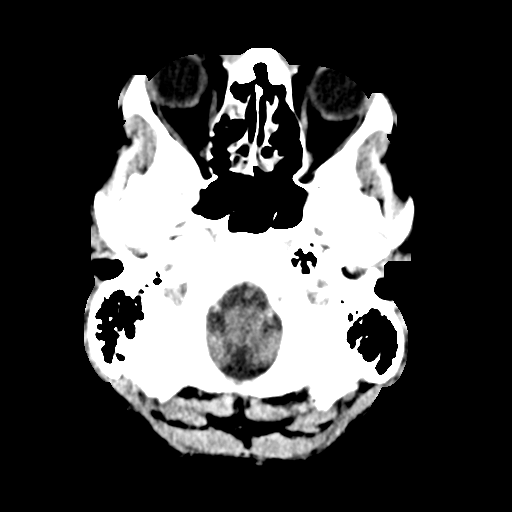
[im 4/32  bone]
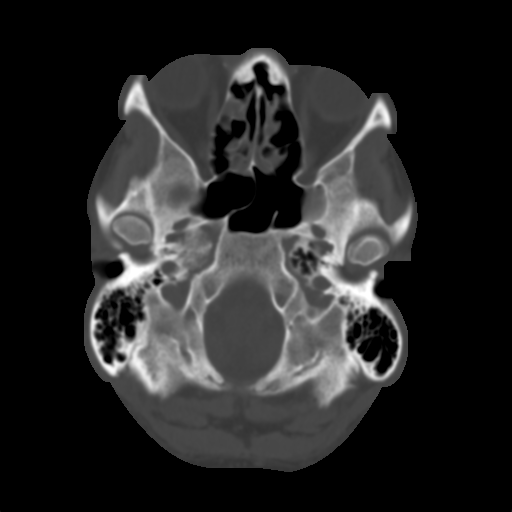
[im 8/32  brain]
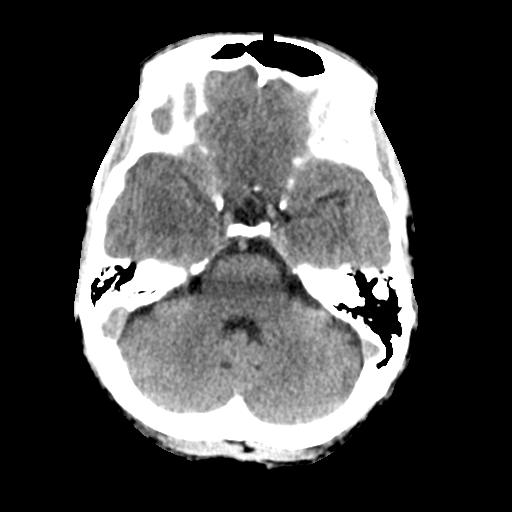
[im 12/32  brain]
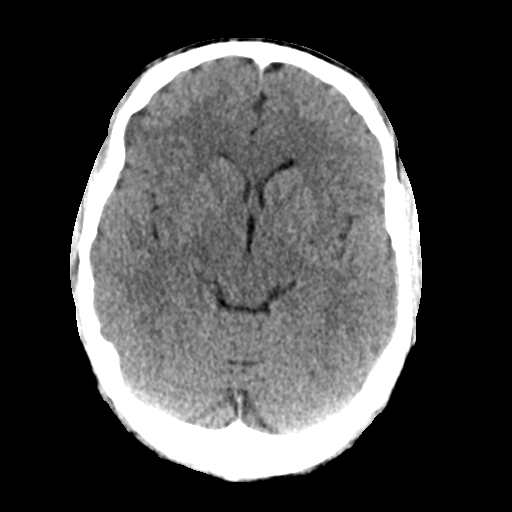
[im 16/32  brain]
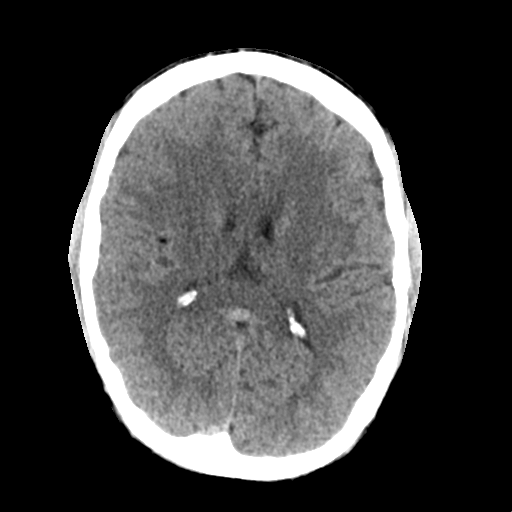
[im 20/32  brain]
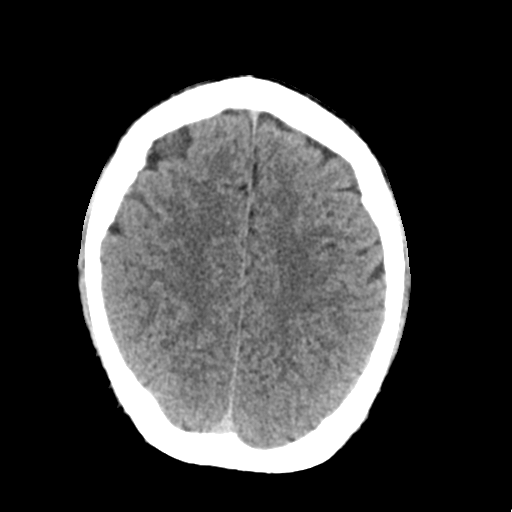
[im 20/32  bone]
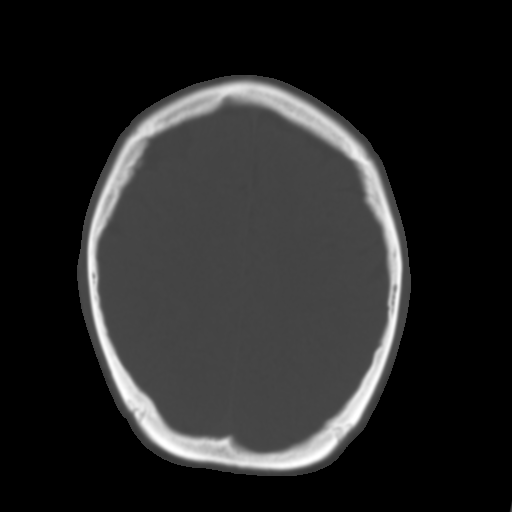
[im 24/32  brain]
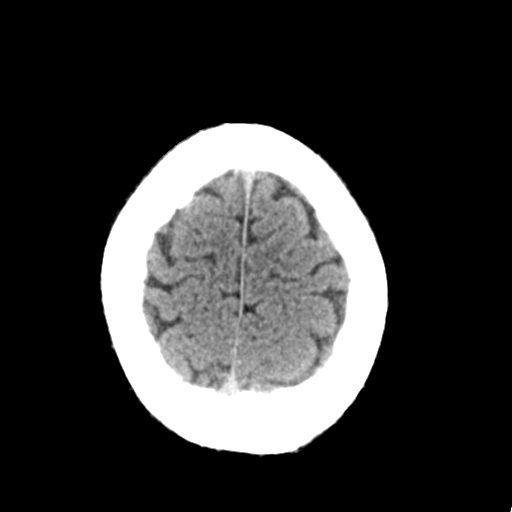
[im 28/32  brain]
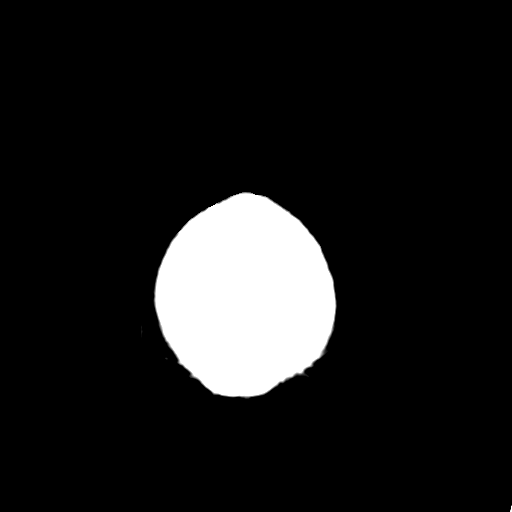

[Series 4: head bone · axial · 0.42mm/px · z∈[-41,-25]mm · 2 of 78 slices shown]
[im 8/78  bone]
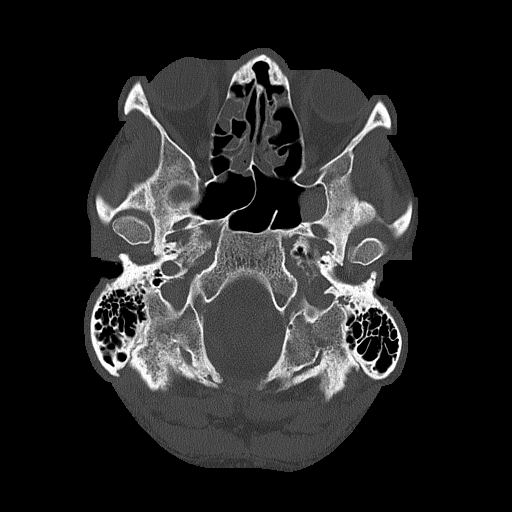
[im 16/78  bone]
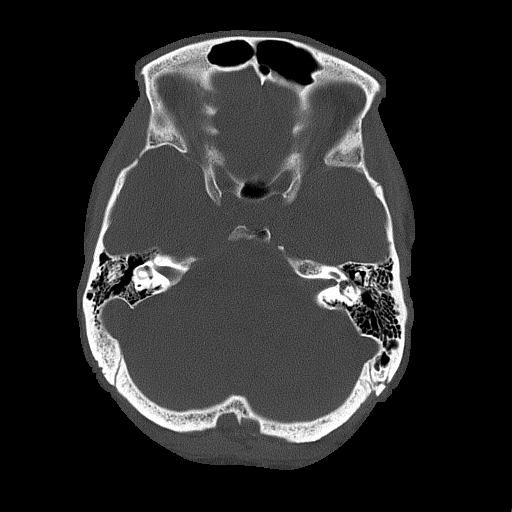

[Series 5: head without cor · coronal · non-contrast · 0.31mm/px · 3 of 65 slices shown]
[im 22/65  brain]
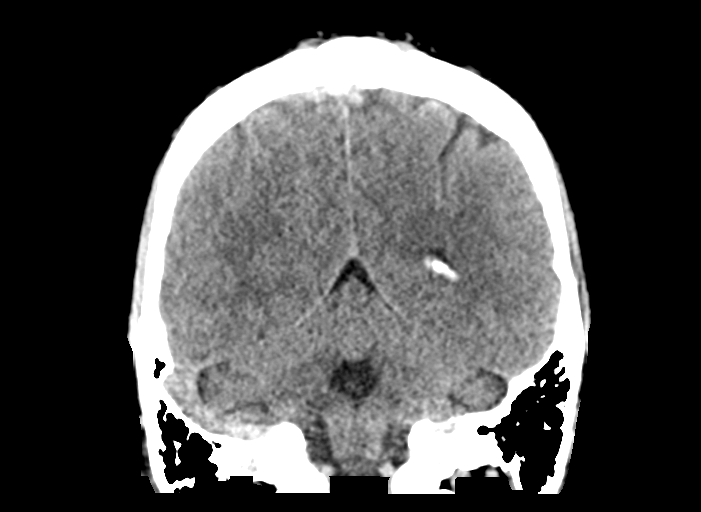
[im 29/65  brain]
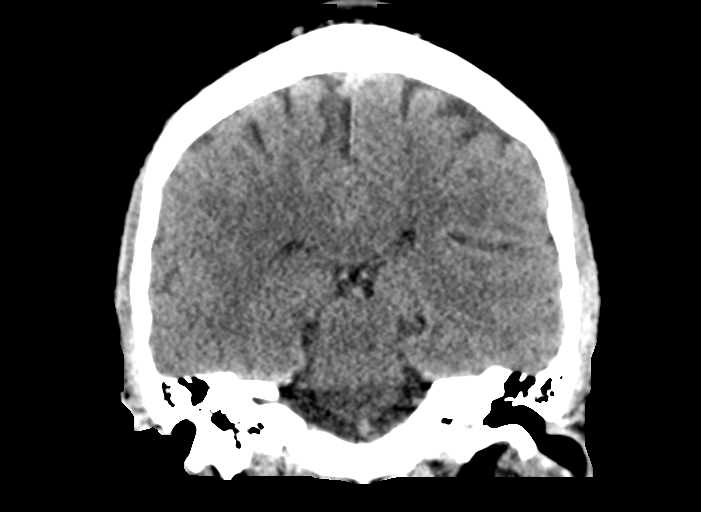
[im 36/65  brain]
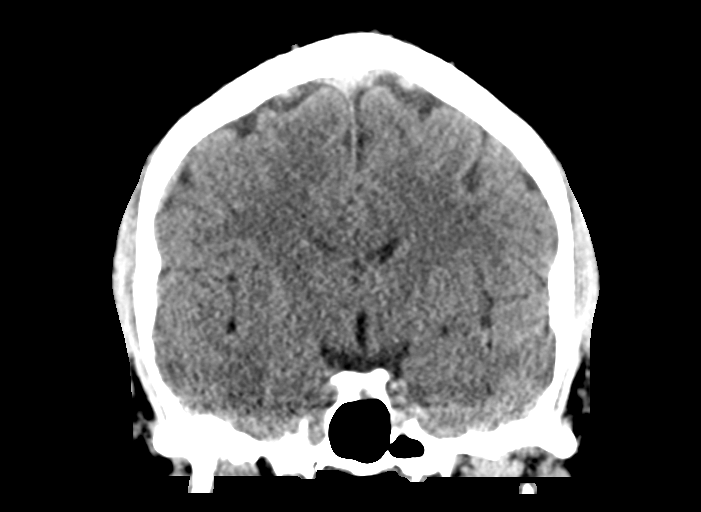

[Series 6: head without sag · sagittal · non-contrast · 0.30mm/px · 3 of 66 slices shown]
[im 22/66  brain]
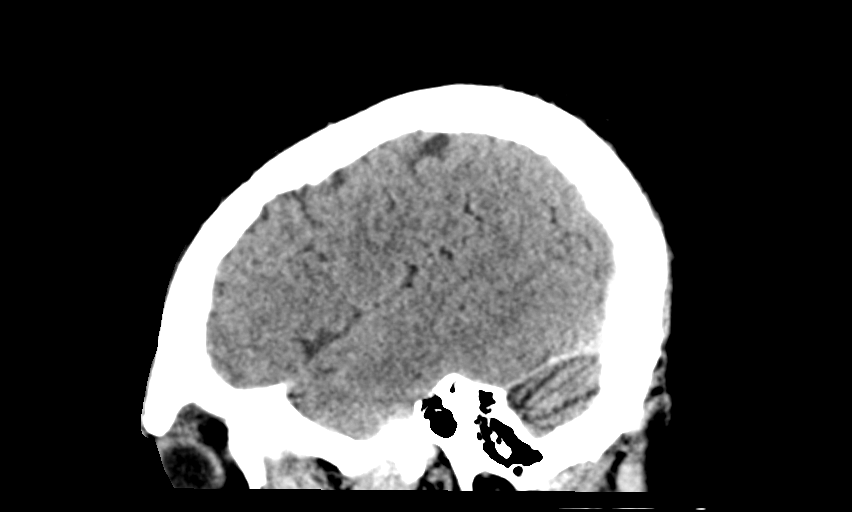
[im 33/66  brain]
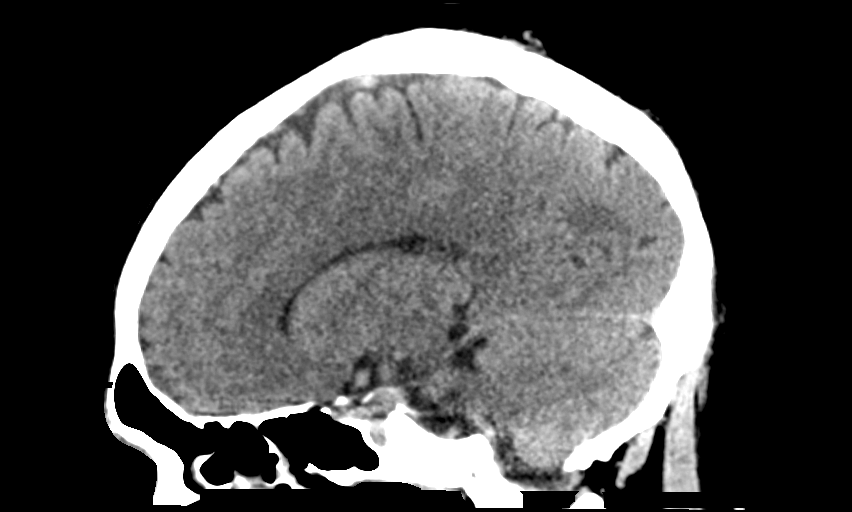
[im 44/66  brain]
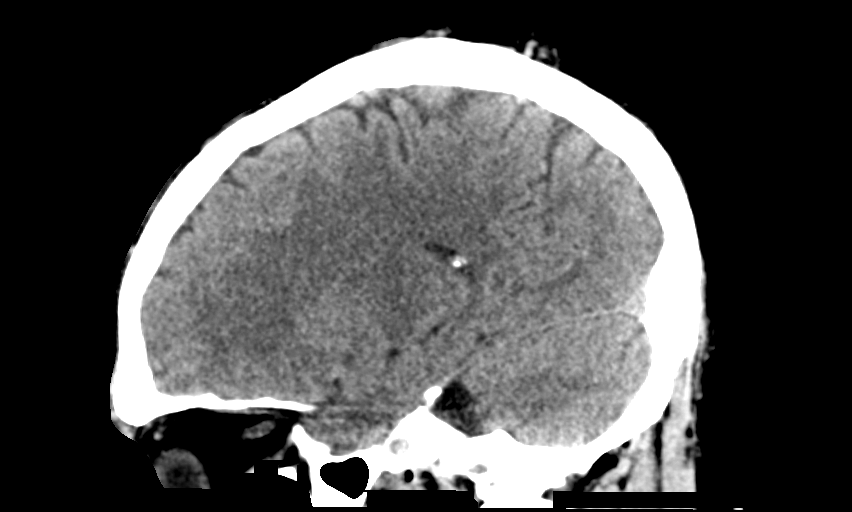

[15 of 47 positions shown; findings below may reference images not displayed]

FINDINGS: Brain: Normal ventricular morphology. No midline shift or mass
effect. Normal appearance of brain parenchyma. No intracranial
hemorrhage, mass lesion, evidence of acute infarction, or
extra-axial fluid collection.

Vascular: Normal appearance

Skull: Normal appearance

Sinuses/Orbits: Clear

Other: N/A
IMPRESSION: Normal exam.

## 2019-05-05 ENCOUNTER — Telehealth: Payer: Self-pay | Admitting: Family Medicine

## 2019-05-05 NOTE — Telephone Encounter (Signed)
He is a non smoker.  Does he mean a nicotine test to ensure he's not smoking? We could check cotinine levels. Would call to verify what is meant. Thanks.

## 2019-05-05 NOTE — Telephone Encounter (Signed)
Patient stated that he is getting ready to do insurance enrollment  He stated that they will do $60 credit to them if they do an exam for smokers, Is this a smoking cessation program? And is this something you could do for the patient or give him advice on where he could have this done?

## 2019-05-06 NOTE — Telephone Encounter (Signed)
Noted.  Fyi to Dr. G.  

## 2019-05-06 NOTE — Telephone Encounter (Signed)
I contacted patient to clarify what was needed. Patient states he made a mistake and he does not need any sort of smoking cessation class or labs to verify he is a non smoker. Patient states he read the benefits information packet wrong, and he does not need anything at this time.  Thanks!

## 2019-07-28 ENCOUNTER — Telehealth: Payer: Self-pay

## 2019-07-28 NOTE — Telephone Encounter (Signed)
LVM to call clinic, pt needs COVID screen, front door  and back lab info 2.1.2021 TLJ

## 2019-07-30 ENCOUNTER — Other Ambulatory Visit: Payer: Self-pay | Admitting: Family Medicine

## 2019-07-30 DIAGNOSIS — E538 Deficiency of other specified B group vitamins: Secondary | ICD-10-CM

## 2019-07-30 DIAGNOSIS — Z131 Encounter for screening for diabetes mellitus: Secondary | ICD-10-CM

## 2019-07-30 DIAGNOSIS — E559 Vitamin D deficiency, unspecified: Secondary | ICD-10-CM

## 2019-07-30 DIAGNOSIS — Z13220 Encounter for screening for lipoid disorders: Secondary | ICD-10-CM

## 2019-07-31 ENCOUNTER — Other Ambulatory Visit (INDEPENDENT_AMBULATORY_CARE_PROVIDER_SITE_OTHER): Payer: BC Managed Care – PPO

## 2019-07-31 ENCOUNTER — Other Ambulatory Visit: Payer: Self-pay

## 2019-07-31 DIAGNOSIS — Z131 Encounter for screening for diabetes mellitus: Secondary | ICD-10-CM

## 2019-07-31 DIAGNOSIS — E559 Vitamin D deficiency, unspecified: Secondary | ICD-10-CM

## 2019-07-31 DIAGNOSIS — Z13220 Encounter for screening for lipoid disorders: Secondary | ICD-10-CM | POA: Diagnosis not present

## 2019-07-31 DIAGNOSIS — E538 Deficiency of other specified B group vitamins: Secondary | ICD-10-CM | POA: Diagnosis not present

## 2019-07-31 LAB — BASIC METABOLIC PANEL
BUN: 13 mg/dL (ref 6–23)
CO2: 30 mEq/L (ref 19–32)
Calcium: 9.4 mg/dL (ref 8.4–10.5)
Chloride: 103 mEq/L (ref 96–112)
Creatinine, Ser: 1.11 mg/dL (ref 0.40–1.50)
GFR: 87.06 mL/min (ref >60.00)
Glucose, Bld: 102 mg/dL — ABNORMAL HIGH (ref 70–99)
Potassium: 4.2 mEq/L (ref 3.5–5.1)
Sodium: 139 mEq/L (ref 135–145)

## 2019-07-31 LAB — LIPID PANEL
Cholesterol: 115 mg/dL (ref 0–200)
HDL: 51.8 mg/dL (ref >39.00)
LDL Cholesterol: 52 mg/dL (ref 0–99)
NonHDL: 62.76
Total CHOL/HDL Ratio: 2
Triglycerides: 53 mg/dL (ref 0.0–149.0)
VLDL: 10.6 mg/dL (ref 0.0–40.0)

## 2019-07-31 LAB — VITAMIN B12: Vitamin B-12: 309 pg/mL (ref 211–911)

## 2019-07-31 LAB — VITAMIN D 25 HYDROXY (VIT D DEFICIENCY, FRACTURES): VITD: 19.48 ng/mL — ABNORMAL LOW (ref 30.00–100.00)

## 2019-08-08 ENCOUNTER — Other Ambulatory Visit: Payer: Self-pay

## 2019-08-08 ENCOUNTER — Ambulatory Visit (INDEPENDENT_AMBULATORY_CARE_PROVIDER_SITE_OTHER): Payer: BC Managed Care – PPO | Admitting: Family Medicine

## 2019-08-08 ENCOUNTER — Encounter: Payer: Self-pay | Admitting: Family Medicine

## 2019-08-08 VITALS — BP 120/84 | HR 90 | Temp 97.8°F | Ht 75.0 in | Wt 245.4 lb

## 2019-08-08 DIAGNOSIS — E559 Vitamin D deficiency, unspecified: Secondary | ICD-10-CM

## 2019-08-08 DIAGNOSIS — J302 Other seasonal allergic rhinitis: Secondary | ICD-10-CM | POA: Diagnosis not present

## 2019-08-08 DIAGNOSIS — Z0000 Encounter for general adult medical examination without abnormal findings: Secondary | ICD-10-CM | POA: Diagnosis not present

## 2019-08-08 DIAGNOSIS — K219 Gastro-esophageal reflux disease without esophagitis: Secondary | ICD-10-CM | POA: Diagnosis not present

## 2019-08-08 DIAGNOSIS — G4733 Obstructive sleep apnea (adult) (pediatric): Secondary | ICD-10-CM | POA: Diagnosis not present

## 2019-08-08 DIAGNOSIS — E538 Deficiency of other specified B group vitamins: Secondary | ICD-10-CM | POA: Insufficient documentation

## 2019-08-08 MED ORDER — VITAMIN D3 25 MCG (1000 UT) PO CAPS: 1.0000 | ORAL_CAPSULE | Freq: Every day | ORAL | Status: DC

## 2019-08-08 MED ORDER — MOMETASONE FUROATE 50 MCG/ACT NA SUSP: NASAL | 11 refills | Status: DC

## 2019-08-08 MED ORDER — B-12 1000 MCG SL SUBL: 1.0000 | SUBLINGUAL_TABLET | Freq: Every day | SUBLINGUAL | Status: DC

## 2019-08-08 NOTE — Assessment & Plan Note (Signed)
Managed with daily nasonex

## 2019-08-08 NOTE — Assessment & Plan Note (Signed)
Preventative protocols reviewed and updated unless pt declined. Discussed healthy diet and lifestyle.  

## 2019-08-08 NOTE — Progress Notes (Signed)
This visit was conducted in person.  BP 120/84 (BP Location: Left Arm, Patient Position: Sitting, Cuff Size: Large)   Pulse 90   Temp 97.8 F (36.6 C) (Temporal)   Ht 6\' 3"  (1.905 m)   Wt 245 lb 6 oz (111.3 kg)   SpO2 97%   BMI 30.67 kg/m    CC: CPE Subjective:    Patient ID: Ricky Watkins, male    DOB: 09-12-1975, 44 y.o.   MRN: [id number]  HPI: Ricky Watkins is a 44 y.o. male presenting on 08/08/2019 for Annual Exam   He is doing well, work going well. Weight loss in the past year - down 16 lbs!  Daily nasonex helps control PNdrainage.   Mild OSA on sleep study 2019 [facility]) - decided to manage without CPAP.   GERD - much better with weight loss. Largely controlled with diet changes - now off sodas.   Preventative: Colon cancer screening - discussed, to start next year  Prostate cancer screening - fmhx in father age 37yo. Nocturia x1-2 - attributes to high amt of water throughout the day. No weakening of stream.   Flu shot yearly Td 2003, may have had Tdap last year for city of [place].  covid vaccine - to get Monday  Seat belt use discussed  Sunscreen use discussed. No changing moles on skin  Non smokers Alcohol - 2 drinks /wk Dentist yearly  Eye exam yearly [facility])   Lives with wife and 2 children  Occupation: Radio producer for [employer]  Activity: increased cardio during pandemic  Diet: good water, fruits/vegetables daily, no sodas     Relevant past medical, surgical, family and social history reviewed and updated as indicated. Interim medical history since our last visit reviewed. Allergies and medications reviewed and updated. Outpatient Medications Prior to Visit  Medication Sig Dispense Refill  . cetirizine (ZYRTEC) 10 MG tablet Take 10 mg by mouth daily as needed.     Marland Kitchen NASONEX 50 MCG/ACT nasal spray PLACE 1 SPRAY INTO THE NOSE DAILY AS NEEDED (ALLERGIES). EACH NOSTRIL 17 g 6  . famotidine (PEPCID) 10 MG tablet Take  10 mg by mouth daily.    Marland Kitchen omeprazole (PRILOSEC) 40 MG capsule Take 1 capsule (40 mg total) by mouth daily as needed (GERD symptoms). 30 capsule 3   No facility-administered medications prior to visit.     Per HPI unless specifically indicated in ROS section below Review of Systems  Constitutional: Negative for activity change, appetite change, chills, fatigue, fever and unexpected weight change.  HENT: Negative for hearing loss.   Eyes: Negative for visual disturbance.  Respiratory: Negative for cough, chest tightness, shortness of breath and wheezing.   Cardiovascular: Negative for chest pain, palpitations and leg swelling.  Gastrointestinal: Negative for abdominal distention, abdominal pain, blood in stool, constipation, diarrhea, nausea and vomiting.  Genitourinary: Negative for difficulty urinating and hematuria.  Musculoskeletal: Negative for arthralgias, myalgias and neck pain.  Skin: Negative for rash.  Neurological: Negative for dizziness, seizures, syncope and headaches.  Hematological: Negative for adenopathy. Does not bruise/bleed easily.  Psychiatric/Behavioral: Negative for dysphoric mood. The patient is not nervous/anxious.    Objective:    BP 120/84 (BP Location: Left Arm, Patient Position: Sitting, Cuff Size: Large)   Pulse 90   Temp 97.8 F (36.6 C) (Temporal)   Ht 6\' 3"  (1.905 m)   Wt 245 lb 6 oz (111.3 kg)   SpO2 97%   BMI 30.67 kg/m  Wt Readings from Last 3 Encounters:  08/08/19 245 lb 6 oz (111.3 kg)  06/07/18 262 lb 8 oz (119.1 kg)  03/27/18 258 lb 8 oz (117.3 kg)    Physical Exam Vitals and nursing note reviewed.  Constitutional:      General: He is not in acute distress.    Appearance: Normal appearance. He is well-developed. He is not ill-appearing.  HENT:     Head: Normocephalic and atraumatic.     Right Ear: Hearing, tympanic membrane, ear canal and external ear normal.     Left Ear: Hearing, tympanic membrane, ear canal and external ear  normal.     Mouth/Throat:     Pharynx: Uvula midline.  Eyes:     General: No scleral icterus.    Extraocular Movements: Extraocular movements intact.     Conjunctiva/sclera: Conjunctivae normal.     Pupils: Pupils are equal, round, and reactive to light.  Cardiovascular:     Rate and Rhythm: Normal rate and regular rhythm.     Pulses: Normal pulses.          Radial pulses are 2+ on the right side and 2+ on the left side.     Heart sounds: Normal heart sounds. No murmur.  Pulmonary:     Effort: Pulmonary effort is normal. No respiratory distress.     Breath sounds: Normal breath sounds. No wheezing, rhonchi or rales.  Abdominal:     General: Abdomen is flat. Bowel sounds are normal. There is no distension.     Palpations: Abdomen is soft. There is no mass.     Tenderness: There is no abdominal tenderness. There is no guarding or rebound.     Hernia: No hernia is present.  Musculoskeletal:        General: Normal range of motion.     Cervical back: Normal range of motion and neck supple.  Lymphadenopathy:     Cervical: No cervical adenopathy.  Skin:    General: Skin is warm and dry.     Findings: No rash.  Neurological:     General: No focal deficit present.     Mental Status: He is alert and oriented to person, place, and time.     Comments: CN grossly intact, station and gait intact  Psychiatric:        Mood and Affect: Mood normal.        Behavior: Behavior normal.        Thought Content: Thought content normal.        Judgment: Judgment normal.       Results for orders placed or performed in visit on [id number]  Basic metabolic panel  Result Value Ref Range   Sodium 139 135 - 145 mEq/L   Potassium 4.2 3.5 - 5.1 mEq/L   Chloride 103 96 - 112 mEq/L   CO2 30 19 - 32 mEq/L   Glucose, Bld 102 (H) 70 - 99 mg/dL   BUN 13 6 - 23 mg/dL   Creatinine, Ser 1.11 0.40 - 1.50 mg/dL   GFR 87.06 >60.00 mL/min   Calcium 9.4 8.4 - 10.5 mg/dL  Lipid panel  Result Value Ref Range    Cholesterol 115 0 - 200 mg/dL   Triglycerides 53.0 0.0 - 149.0 mg/dL   HDL 51.80 >39.00 mg/dL   VLDL 10.6 0.0 - 40.0 mg/dL   LDL Cholesterol 52 0 - 99 mg/dL   Total CHOL/HDL Ratio 2    NonHDL 62.76   VITAMIN D 25 Hydroxy (Vit-D  Deficiency, Fractures)  Result Value Ref Range   VITD 19.48 (L) 30.00 - 100.00 ng/mL  Vitamin B12  Result Value Ref Range   Vitamin B-12 309 211 - 911 pg/mL   Assessment & Plan:  This visit occurred during the SARS-CoV-2 public health emergency.  Safety protocols were in place, including screening questions prior to the visit, additional usage of staff PPE, and extensive cleaning of exam room while observing appropriate contact time as indicated for disinfecting solutions.   Problem List Items Addressed This Visit    Vitamin D deficiency    Noted today - rec start 1000 IU daily.       Seasonal allergic rhinitis    Managed with daily nasonex      OSA (obstructive sleep apnea)    Mild on sleep study (2019) - weight loss has helped.      Low serum vitamin B12    rec start b12 580mcg daily replacement.       Health maintenance examination - Primary    Preventative protocols reviewed and updated unless pt declined. Discussed healthy diet and lifestyle.       GERD (gastroesophageal reflux disease)    Managed with weight loss and diet.           Meds ordered this encounter  Medications  . mometasone (NASONEX) 50 MCG/ACT nasal spray    Sig: PLACE 1 SPRAY INTO THE NOSE DAILY AS NEEDED (ALLERGIES). EACH NOSTRIL    Dispense:  17 g    Refill:  11  . Cyanocobalamin (B-12) 1000 MCG SUBL    Sig: Place 1 tablet under the tongue daily.  . Cholecalciferol (VITAMIN D3) 25 MCG (1000 UT) CAPS    Sig: Take 1 capsule (1,000 Units total) by mouth daily.    Dispense:  30 capsule   No orders of the defined types were placed in this encounter.   Follow up plan: Return in about 1 year (around 08/07/2020) for annual exam, prior fasting for blood work.  Ria Bush, MD

## 2019-08-08 NOTE — Assessment & Plan Note (Signed)
Mild on sleep study (2019) - weight loss has helped.

## 2019-08-08 NOTE — Assessment & Plan Note (Signed)
Managed with weight loss and diet.

## 2019-08-08 NOTE — Assessment & Plan Note (Signed)
Noted today - rec start 1000 IU daily.

## 2019-08-08 NOTE — Assessment & Plan Note (Signed)
rec start b12 594mcg daily replacement.

## 2019-08-08 NOTE — Patient Instructions (Addendum)
Double check on latest tetanus shot (Td vs Tdap) with state records and let me know so we can update your chart.  You are doing well today.  Return as needed or in 1 year for next physical.   Health Maintenance, Male Adopting a healthy lifestyle and getting preventive care are important in promoting health and wellness. Ask your health care provider about:  The right schedule for you to have regular tests and exams.  Things you can do on your own to prevent diseases and keep yourself healthy. What should I know about diet, weight, and exercise? Eat a healthy diet   Eat a diet that includes plenty of vegetables, fruits, low-fat dairy products, and lean protein.  Do not eat a lot of foods that are high in solid fats, added sugars, or sodium. Maintain a healthy weight Body mass index (BMI) is a measurement that can be used to identify possible weight problems. It estimates body fat based on height and weight. Your health care provider can help determine your BMI and help you achieve or maintain a healthy weight. Get regular exercise Get regular exercise. This is one of the most important things you can do for your health. Most adults should:  Exercise for at least 150 minutes each week. The exercise should increase your heart rate and make you sweat (moderate-intensity exercise).  Do strengthening exercises at least twice a week. This is in addition to the moderate-intensity exercise.  Spend less time sitting. Even light physical activity can be beneficial. Watch cholesterol and blood lipids Have your blood tested for lipids and cholesterol at 44 years of age, then have this test every 5 years. You may need to have your cholesterol levels checked more often if:  Your lipid or cholesterol levels are high.  You are older than 44 years of age.  You are at high risk for heart disease. What should I know about cancer screening? Many types of cancers can be detected early and may often be  prevented. Depending on your health history and family history, you may need to have cancer screening at various ages. This may include screening for:  Colorectal cancer.  Prostate cancer.  Skin cancer.  Lung cancer. What should I know about heart disease, diabetes, and high blood pressure? Blood pressure and heart disease  High blood pressure causes heart disease and increases the risk of stroke. This is more likely to develop in people who have high blood pressure readings, are of African descent, or are overweight.  Talk with your health care provider about your target blood pressure readings.  Have your blood pressure checked: ? Every 3-5 years if you are 27-35 years of age. ? Every year if you are 51 years old or older.  If you are between the ages of 51 and 66 and are a current or former smoker, ask your health care provider if you should have a one-time screening for abdominal aortic aneurysm (AAA). Diabetes Have regular diabetes screenings. This checks your fasting blood sugar level. Have the screening done:  Once every three years after age 33 if you are at a normal weight and have a low risk for diabetes.  More often and at a younger age if you are overweight or have a high risk for diabetes. What should I know about preventing infection? Hepatitis B If you have a higher risk for hepatitis B, you should be screened for this virus. Talk with your health care provider to find out if you  are at risk for hepatitis B infection. Hepatitis C Blood testing is recommended for:  Everyone born from [birth year] through 1965.  Anyone with known risk factors for hepatitis C. Sexually transmitted infections (STIs)  You should be screened each year for STIs, including gonorrhea and chlamydia, if: ? You are sexually active and are younger than 44 years of age. ? You are older than 44 years of age and your health care provider tells you that you are at risk for this type of  infection. ? Your sexual activity has changed since you were last screened, and you are at increased risk for chlamydia or gonorrhea. Ask your health care provider if you are at risk.  Ask your health care provider about whether you are at high risk for HIV. Your health care provider may recommend a prescription medicine to help prevent HIV infection. If you choose to take medicine to prevent HIV, you should first get tested for HIV. You should then be tested every 3 months for as long as you are taking the medicine. Follow these instructions at home: Lifestyle  Do not use any products that contain nicotine or tobacco, such as cigarettes, e-cigarettes, and chewing tobacco. If you need help quitting, ask your health care provider.  Do not use street drugs.  Do not share needles.  Ask your health care provider for help if you need support or information about quitting drugs. Alcohol use  Do not drink alcohol if your health care provider tells you not to drink.  If you drink alcohol: ? Limit how much you have to 0-2 drinks a day. ? Be aware of how much alcohol is in your drink. In the [place] one drink equals one 12 oz bottle of beer (355 mL), one 5 oz glass of wine (148 mL), or one 1 oz glass of hard liquor (44 mL). General instructions  Schedule regular health, dental, and eye exams.  Stay current with your vaccines.  Tell your health care provider if: ? You often feel depressed. ? You have ever been abused or do not feel safe at home. Summary  Adopting a healthy lifestyle and getting preventive care are important in promoting health and wellness.  Follow your health care provider's instructions about healthy diet, exercising, and getting tested or screened for diseases.  Follow your health care provider's instructions on monitoring your cholesterol and blood pressure. This information is not intended to replace advice given to you by your health care provider. Make sure you  discuss any questions you have with your health care provider. Document Revised: 06/05/2018 Document Reviewed: 06/05/2018 Elsevier Patient Education  2020 [employer].

## 2020-04-27 ENCOUNTER — Encounter: Payer: Self-pay | Admitting: Internal Medicine

## 2020-04-27 ENCOUNTER — Ambulatory Visit (INDEPENDENT_AMBULATORY_CARE_PROVIDER_SITE_OTHER): Payer: BC Managed Care – PPO | Admitting: Internal Medicine

## 2020-04-27 ENCOUNTER — Other Ambulatory Visit: Payer: Self-pay

## 2020-04-27 VITALS — BP 122/88 | HR 84 | Temp 97.0°F | Ht 75.0 in | Wt 245.0 lb

## 2020-04-27 DIAGNOSIS — S39012A Strain of muscle, fascia and tendon of lower back, initial encounter: Secondary | ICD-10-CM | POA: Diagnosis not present

## 2020-04-27 NOTE — Assessment & Plan Note (Signed)
Clearly seems to be just muscular  No neurologic symptoms like past disc injury Reassured Discussed limiting lifting, etc Heat Trial naproxen 440 bid for a week

## 2020-04-27 NOTE — Progress Notes (Signed)
Subjective:    Patient ID: MARTYN TIMME, male    DOB: 09/24/1975, 44 y.o.   MRN: [id number]  HPI Here due to back pain This visit occurred during the SARS-CoV-2 public health emergency.  Safety protocols were in place, including screening questions prior to the visit, additional usage of staff PPE, and extensive cleaning of exam room while observing appropriate contact time as indicated for disinfecting solutions.   Came in because of his wife  Started on Sunday morning 3 weeks ago Lamonte Sakai done a different ab exercise----lateral movement with weights Next day---lifted heavy plastic storage unit Next day ---he woke with tightness in back  Pain is in low back Fairly constant Stiffness is not new---takes a while to loosen up in the morning Hasn't been able to go to the gym since this Pain when lifting and getting out of chair  Known past herniated disc 20 years ago---will have flare up every 5 years or so (if he strains it)  Pain is low right back--no sig radiation Will hurt over more of low back as the day goes on No leg pain or weakness No abnormal sensation in groin or legs  Has tried rare tylenol--not clearly helpful (will take before he does chores) Heating pad helps  Current Outpatient Medications on File Prior to Visit  Medication Sig Dispense Refill  . cetirizine (ZYRTEC) 10 MG tablet Take 10 mg by mouth daily as needed.     . Cholecalciferol (VITAMIN D3) 25 MCG (1000 UT) CAPS Take 1 capsule (1,000 Units total) by mouth daily. 30 capsule   . Cyanocobalamin (B-12) 1000 MCG SUBL Place 1 tablet under the tongue daily.    . mometasone (NASONEX) 50 MCG/ACT nasal spray PLACE 1 SPRAY INTO THE NOSE DAILY AS NEEDED (ALLERGIES). EACH NOSTRIL 17 g 11   No current facility-administered medications on file prior to visit.    No Known Allergies  Past Medical History:  Diagnosis Date  . Allergic rhinitis   . History of herniated intervertebral disc     Past Surgical  History:  Procedure Laterality Date  . NO PAST SURGERIES      Family History  Problem Relation Age of Onset  . Cancer Father 57       prostate  . Cancer Paternal Aunt        breast  . Stroke Paternal Uncle   . Hypertension Neg Hx   . Diabetes Neg Hx   . CAD Neg Hx     Social History   Socioeconomic History  . Marital status: Married    Spouse name: Not on file  . Number of children: Not on file  . Years of education: Not on file  . Highest education level: Not on file  Occupational History  . Not on file  Tobacco Use  . Smoking status: Never Smoker  . Smokeless tobacco: Never Used  Vaping Use  . Vaping Use: Never used  Substance and Sexual Activity  . Alcohol use: Yes    Comment: rarley  . Drug use: No  . Sexual activity: Not on file  Other Topics Concern  . 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   Social Determinants of Health   Financial Resource Strain:   . Difficulty  of Paying Living Expenses: Not on file  Food Insecurity:   . Worried About Charity fundraiser in the Last Year: Not on file  . Ran Out of Food in the Last Year: Not on file  Transportation Needs:   . Lack of Transportation (Medical): Not on file  . Lack of Transportation (Non-Medical): Not on file  Physical Activity:   . Days of Exercise per Week: Not on file  . Minutes of Exercise per Session: Not on file  Stress:   . Feeling of Stress : Not on file  Social Connections:   . Frequency of Communication with Friends and Family: Not on file  . Frequency of Social Gatherings with Friends and Family: Not on file  . Attends Religious Services: Not on file  . Active Member of Clubs or Organizations: Not on file  . Attends Archivist Meetings: Not on file  . Marital Status: Not on  file  Intimate Partner Violence:   . Fear of Current or Ex-Partner: Not on file  . Emotionally Abused: Not on file  . Physically Abused: Not on file  . Sexually Abused: Not on file   Review of Systems No loss of control with bowels or bladder No problems sleeping    Objective:   Physical Exam Constitutional:      Appearance: Normal appearance.  Musculoskeletal:     Comments: No spine or back tenderness Back flexion fairly normal SLR negative Normal ROM in hips  Neurological:     Mental Status: He is alert.     Comments: Normal gait and leg strength            Assessment & Plan:

## 2020-04-27 NOTE — Patient Instructions (Signed)
Please try naproxen 220 mg --- 2 tabs twice a day (breakfast and supper) for a week. Hopefully your back should be improving by then.

## 2020-06-04 ENCOUNTER — Ambulatory Visit: Payer: BC Managed Care – PPO | Admitting: Family Medicine

## 2020-08-08 ENCOUNTER — Other Ambulatory Visit: Payer: Self-pay | Admitting: Family Medicine

## 2020-08-08 DIAGNOSIS — E538 Deficiency of other specified B group vitamins: Secondary | ICD-10-CM

## 2020-08-08 DIAGNOSIS — Z0000 Encounter for general adult medical examination without abnormal findings: Secondary | ICD-10-CM

## 2020-08-08 DIAGNOSIS — Z125 Encounter for screening for malignant neoplasm of prostate: Secondary | ICD-10-CM

## 2020-08-08 DIAGNOSIS — E559 Vitamin D deficiency, unspecified: Secondary | ICD-10-CM

## 2020-08-08 NOTE — Addendum Note (Signed)
Addended by: Ria Bush on: 08/08/2020 09:53 PM   Modules accepted: Orders

## 2020-08-09 ENCOUNTER — Other Ambulatory Visit: Payer: BC Managed Care – PPO

## 2020-08-12 NOTE — Progress Notes (Deleted)
Patient ID: Ricky Watkins, male    DOB: 1975-10-30, 45 y.o.   MRN: [id number]  This visit was conducted in person.  There were no vitals taken for this visit.   CC: CPE Subjective:   HPI: Ricky Watkins is a 45 y.o. male presenting on 08/13/2020 for No chief complaint on file.   Preventative: Colon cancer screening - discussed, to start next year  Prostate cancer screening - fmhx in father age 43yo. Nocturia x1-2 - attributes to high amt of water throughout the day. No weakening of stream.   Flu shot yearly Td 2003, may have had Tdap last year for city of [place].  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 Interim medical history since our last visit reviewed. Allergies and medications reviewed and updated. Outpatient Medications Prior to Visit  Medication Sig Dispense Refill  . cetirizine (ZYRTEC) 10 MG tablet Take 10 mg by mouth daily as needed.     . Cholecalciferol (VITAMIN D3) 25 MCG (1000 UT) CAPS Take 1 capsule (1,000 Units total) by mouth daily. 30 capsule   . Cyanocobalamin (B-12) 1000 MCG SUBL Place 1 tablet under the tongue daily.    . mometasone (NASONEX) 50 MCG/ACT nasal spray PLACE 1 SPRAY INTO THE NOSE DAILY AS NEEDED (ALLERGIES). EACH NOSTRIL 17 g 11   No facility-administered medications prior to visit.     Per HPI unless specifically indicated in ROS section below Review of Systems  Constitutional: Negative for activity change, appetite change, chills, fatigue, fever and unexpected weight change.  HENT:  Negative for hearing loss.   Eyes: Negative for visual disturbance.  Respiratory: Negative for cough, chest tightness, shortness of breath and wheezing.   Cardiovascular: Negative for chest pain, palpitations and leg swelling.  Gastrointestinal: Negative for abdominal distention, abdominal pain, blood in stool, constipation, diarrhea, nausea and vomiting.  Genitourinary: Negative for difficulty urinating and hematuria.  Musculoskeletal: Negative for arthralgias, myalgias and neck pain.  Skin: Negative for rash.  Neurological: Negative for dizziness, seizures, syncope and headaches.  Hematological: Negative for adenopathy. Does not bruise/bleed easily.  Psychiatric/Behavioral: Negative for dysphoric mood. The patient is not nervous/anxious.    Objective:  There were no vitals taken for this visit.  Wt Readings from Last 3 Encounters:  04/27/20 245 lb (111.1 kg)  08/08/19 245 lb 6 oz (111.3 kg)  06/07/18 262 lb 8 oz (119.1 kg)      Physical Exam Vitals and nursing note reviewed.  Constitutional:      General: He is not in acute distress.    Appearance: Normal appearance. He is well-developed and well-nourished. He is not ill-appearing.  HENT:     Head: Normocephalic and atraumatic.     Right Ear: Hearing, tympanic membrane, ear canal and external ear normal.     Left Ear: Hearing, tympanic membrane, ear canal and external ear normal.     Mouth/Throat:     Mouth: Oropharynx is clear and moist and mucous membranes are normal.     Pharynx: No posterior oropharyngeal edema.  Eyes:  General: No scleral icterus.    Extraocular Movements: Extraocular movements intact and EOM normal.     Conjunctiva/sclera: Conjunctivae normal.     Pupils: Pupils are equal, round, and reactive to light.  Neck:     Thyroid: No thyroid mass or thyromegaly.  Cardiovascular:     Rate and Rhythm: Normal rate and regular rhythm.     Pulses: Normal pulses and intact distal pulses.          Radial pulses  are 2+ on the right side and 2+ on the left side.     Heart sounds: Normal heart sounds. No murmur heard.   Pulmonary:     Effort: Pulmonary effort is normal. No respiratory distress.     Breath sounds: Normal breath sounds. No wheezing, rhonchi or rales.  Abdominal:     General: Abdomen is flat. Bowel sounds are normal. There is no distension.     Palpations: Abdomen is soft. There is no mass.     Tenderness: There is no abdominal tenderness. There is no guarding or rebound.     Hernia: No hernia is present.  Musculoskeletal:        General: No edema. Normal range of motion.     Cervical back: Normal range of motion and neck supple.     Right lower leg: No edema.     Left lower leg: No edema.  Lymphadenopathy:     Cervical: No cervical adenopathy.  Skin:    General: Skin is warm and dry.     Findings: No rash.  Neurological:     General: No focal deficit present.     Mental Status: He is alert and oriented to person, place, and time.     Comments: CN grossly intact, station and gait intact  Psychiatric:        Mood and Affect: Mood and affect and mood normal.        Behavior: Behavior normal.        Thought Content: Thought content normal.        Judgment: Judgment normal.       Results for orders placed or performed in visit on 54/09/81  Basic metabolic panel  Result Value Ref Range   Sodium 139 135 - 145 mEq/L   Potassium 4.2 3.5 - 5.1 mEq/L   Chloride 103 96 - 112 mEq/L   CO2 30 19 - 32 mEq/L   Glucose, Bld 102 (H) 70 - 99 mg/dL   BUN 13 6 - 23 mg/dL   Creatinine, Ser 1.11 0.40 - 1.50 mg/dL   GFR 87.06 >60.00 mL/min   Calcium 9.4 8.4 - 10.5 mg/dL  Lipid panel  Result Value Ref Range   Cholesterol 115 0 - 200 mg/dL   Triglycerides 53.0 0.0 - 149.0 mg/dL   HDL 51.80 >39.00 mg/dL   VLDL 10.6 0.0 - 40.0 mg/dL   LDL Cholesterol 52 0 - 99 mg/dL   Total CHOL/HDL Ratio 2    NonHDL 62.76   VITAMIN D 25 Hydroxy (Vit-D Deficiency, Fractures)  Result Value Ref Range    VITD 19.48 (L) 30.00 - 100.00 ng/mL  Vitamin B12  Result Value Ref Range   Vitamin B-12 309 211 - 911 pg/mL   Assessment & Plan:  This visit occurred during the SARS-CoV-2 public health emergency.  Safety protocols were in place, including screening questions prior to the visit, additional usage of staff PPE, and extensive cleaning of exam room while observing appropriate contact time as indicated for  disinfecting solutions.   Problem List Items Addressed This Visit   None      No orders of the defined types were placed in this encounter.  No orders of the defined types were placed in this encounter.   Follow up plan: No follow-ups on file.  Ria Bush, MD

## 2020-08-13 ENCOUNTER — Encounter: Payer: BC Managed Care – PPO | Admitting: Family Medicine

## 2020-08-13 DIAGNOSIS — Z0289 Encounter for other administrative examinations: Secondary | ICD-10-CM

## 2020-09-01 ENCOUNTER — Other Ambulatory Visit: Payer: Self-pay | Admitting: Family Medicine

## 2021-01-12 ENCOUNTER — Other Ambulatory Visit: Payer: Self-pay

## 2021-01-13 ENCOUNTER — Other Ambulatory Visit: Payer: Self-pay

## 2021-01-19 ENCOUNTER — Encounter: Payer: Self-pay | Admitting: Family Medicine

## 2021-04-21 ENCOUNTER — Other Ambulatory Visit: Payer: Self-pay

## 2021-04-25 ENCOUNTER — Encounter: Payer: Self-pay | Admitting: Family Medicine

## 2021-06-22 ENCOUNTER — Other Ambulatory Visit: Payer: Self-pay

## 2021-06-22 ENCOUNTER — Ambulatory Visit: Payer: BC Managed Care – PPO | Admitting: Family Medicine

## 2021-06-22 ENCOUNTER — Encounter: Payer: Self-pay | Admitting: Family Medicine

## 2021-06-22 ENCOUNTER — Other Ambulatory Visit: Payer: Self-pay | Admitting: Family Medicine

## 2021-06-22 VITALS — BP 134/82 | HR 84 | Temp 97.7°F | Ht 75.0 in | Wt 253.6 lb

## 2021-06-22 DIAGNOSIS — K30 Functional dyspepsia: Secondary | ICD-10-CM | POA: Diagnosis not present

## 2021-06-22 DIAGNOSIS — Z23 Encounter for immunization: Secondary | ICD-10-CM

## 2021-06-22 DIAGNOSIS — Z125 Encounter for screening for malignant neoplasm of prostate: Secondary | ICD-10-CM

## 2021-06-22 DIAGNOSIS — Z1211 Encounter for screening for malignant neoplasm of colon: Secondary | ICD-10-CM | POA: Diagnosis not present

## 2021-06-22 DIAGNOSIS — E559 Vitamin D deficiency, unspecified: Secondary | ICD-10-CM | POA: Diagnosis not present

## 2021-06-22 DIAGNOSIS — E538 Deficiency of other specified B group vitamins: Secondary | ICD-10-CM | POA: Diagnosis not present

## 2021-06-22 DIAGNOSIS — K219 Gastro-esophageal reflux disease without esophagitis: Secondary | ICD-10-CM | POA: Diagnosis not present

## 2021-06-22 LAB — POC URINALSYSI DIPSTICK (AUTOMATED)
Bilirubin, UA: NEGATIVE
Blood, UA: NEGATIVE
Glucose, UA: NEGATIVE
Ketones, UA: NEGATIVE
Leukocytes, UA: NEGATIVE
Nitrite, UA: NEGATIVE
Protein, UA: NEGATIVE
Spec Grav, UA: 1.02 (ref 1.010–1.025)
Urobilinogen, UA: 0.2 E.U./dL
pH, UA: 6 (ref 5.0–8.0)

## 2021-06-22 LAB — COMPREHENSIVE METABOLIC PANEL
ALT: 17 U/L (ref 0–53)
AST: 21 U/L (ref 0–37)
Albumin: 4.4 g/dL (ref 3.5–5.2)
Alkaline Phosphatase: 53 U/L (ref 39–117)
BUN: 14 mg/dL (ref 6–23)
CO2: 31 mEq/L (ref 19–32)
Calcium: 9.4 mg/dL (ref 8.4–10.5)
Chloride: 102 mEq/L (ref 96–112)
Creatinine, Ser: 1.1 mg/dL (ref 0.40–1.50)
GFR: 80.82 mL/min (ref >60.00)
Glucose, Bld: 87 mg/dL (ref 70–99)
Potassium: 4 mEq/L (ref 3.5–5.1)
Sodium: 141 mEq/L (ref 135–145)
Total Bilirubin: 0.6 mg/dL (ref 0.2–1.2)
Total Protein: 6.8 g/dL (ref 6.0–8.3)

## 2021-06-22 LAB — CBC WITH DIFFERENTIAL/PLATELET
Basophils Absolute: 0 10*3/uL (ref 0.0–0.1)
Basophils Relative: 0.6 % (ref 0.0–3.0)
Eosinophils Absolute: 0.2 10*3/uL (ref 0.0–0.7)
Eosinophils Relative: 4.5 % (ref 0.0–5.0)
HCT: 42.5 % (ref 39.0–52.0)
Hemoglobin: 13.9 g/dL (ref 13.0–17.0)
Lymphocytes Relative: 41.5 % (ref 12.0–46.0)
Lymphs Abs: 1.6 10*3/uL (ref 0.7–4.0)
MCHC: 32.8 g/dL (ref 30.0–36.0)
MCV: 82 fl (ref 78.0–100.0)
Monocytes Absolute: 0.3 10*3/uL (ref 0.1–1.0)
Monocytes Relative: 6.7 % (ref 3.0–12.0)
Neutro Abs: 1.9 10*3/uL (ref 1.4–7.7)
Neutrophils Relative %: 46.7 % (ref 43.0–77.0)
Platelets: 194 10*3/uL (ref 150.0–400.0)
RBC: 5.18 Mil/uL (ref 4.22–5.81)
RDW: 14.3 % (ref 11.5–15.5)
WBC: 4 10*3/uL (ref 4.0–10.5)

## 2021-06-22 LAB — VITAMIN D 25 HYDROXY (VIT D DEFICIENCY, FRACTURES): VITD: 17.38 ng/mL — ABNORMAL LOW (ref 30.00–100.00)

## 2021-06-22 LAB — VITAMIN B12: Vitamin B-12: 331 pg/mL (ref 211–911)

## 2021-06-22 LAB — LIPASE: Lipase: 11 U/L (ref 11.0–59.0)

## 2021-06-22 LAB — PSA: PSA: 0.26 ng/mL (ref 0.10–4.00)

## 2021-06-22 LAB — TSH: TSH: 1.44 u[IU]/mL (ref 0.35–5.50)

## 2021-06-22 MED ORDER — CYANOCOBALAMIN 500 MCG PO TABS: 500.0000 ug | ORAL_TABLET | Freq: Every day | ORAL | Status: DC

## 2021-06-22 MED ORDER — VITAMIN D 50 MCG (2000 UT) PO CAPS: 1.0000 | ORAL_CAPSULE | Freq: Every day | ORAL | Status: DC

## 2021-06-22 NOTE — Assessment & Plan Note (Signed)
Update levels. 

## 2021-06-22 NOTE — Assessment & Plan Note (Signed)
Controlled with diet changes.

## 2021-06-22 NOTE — Progress Notes (Signed)
Patient ID: Ricky Watkins, male    DOB: Dec 21, 1975, 45 y.o.   MRN: [id number]  This visit was conducted in person.  BP 134/82    Pulse 84    Temp 97.7 F (36.5 C) (Temporal)    Ht 6\' 3"  (1.905 m)    Wt 253 lb 9 oz (115 kg)    SpO2 97%    BMI 31.69 kg/m    CC: abd pain, nausea Subjective:   HPI: Ricky Watkins is a 45 y.o. male presenting on 06/22/2021 for Abdominal Pain (C/o abd pain in mornings and nausea.  Started 3-4 wks ago.  Denies vomiting or diarrhea. )   3-4 wk h/o sensation of fullness/bloating to mid abdomen associated with mild nausea in the mornings and evenings. Indigestion. Symptoms come on with food. Normally 2 BMs/day.  No new diet changes or medicines/supplements.  No fevers/chills, vomiting, diarrhea or constipation. No dysphagia or early satiety, weight loss.  Weight gain noted.   H/o GERD previously treated with omeprazole PPI with benefit. This significantly improved with diet changes.  Avoids sodas due to worsening GERD. Does drink alcohol socially. Does drink 3-8 cups of coffee every morning. Water the rest of the day.   Discussed colon cancer screening - interested in colonoscopy  Both grandfather and father battled cancer - father prostate, grandfather lung.      Relevant past medical, surgical, family and social history reviewed and updated as indicated. Interim medical history since our last visit reviewed. Allergies and medications reviewed and updated. Outpatient Medications Prior to Visit  Medication Sig Dispense Refill   cetirizine (ZYRTEC) 10 MG tablet Take 10 mg by mouth daily as needed.      mometasone (NASONEX) 50 MCG/ACT nasal spray PLACE 1 SPRAY INTO THE NOSE DAILY AS NEEDED (ALLERGIES). EACH NOSTRIL 17 each 11   Cholecalciferol (VITAMIN D3) 25 MCG (1000 UT) CAPS Take 1 capsule (1,000 Units total) by mouth daily. 30 capsule    Cyanocobalamin (B-12) 1000 MCG SUBL Place 1 tablet under the tongue daily.     No facility-administered  medications prior to visit.     Per HPI unless specifically indicated in ROS section below Review of Systems  Objective:  BP 134/82    Pulse 84    Temp 97.7 F (36.5 C) (Temporal)    Ht 6\' 3"  (1.905 m)    Wt 253 lb 9 oz (115 kg)    SpO2 97%    BMI 31.69 kg/m   Wt Readings from Last 3 Encounters:  06/22/21 253 lb 9 oz (115 kg)  04/27/20 245 lb (111.1 kg)  08/08/19 245 lb 6 oz (111.3 kg)      Physical Exam Vitals and nursing note reviewed.  Constitutional:      Appearance: Normal appearance. He is not ill-appearing.  HENT:     Head: Normocephalic and atraumatic.  Eyes:     Extraocular Movements: Extraocular movements intact.     Conjunctiva/sclera: Conjunctivae normal.     Pupils: Pupils are equal, round, and reactive to light.  Neck:     Thyroid: No thyroid mass or thyromegaly.  Cardiovascular:     Rate and Rhythm: Normal rate and regular rhythm.     Pulses: Normal pulses.     Heart sounds: Normal heart sounds. No murmur heard. Pulmonary:     Effort: Pulmonary effort is normal. No respiratory distress.     Breath sounds: Normal breath sounds. No wheezing, rhonchi or rales.  Abdominal:  General: Abdomen is flat. Bowel sounds are normal. There is no distension.     Palpations: Abdomen is soft. There is no hepatomegaly, splenomegaly or mass.     Tenderness: There is no abdominal tenderness. There is no guarding or rebound. Negative signs include Murphy's sign.     Hernia: No hernia is present.  Musculoskeletal:     Right lower leg: No edema.     Left lower leg: No edema.  Skin:    General: Skin is warm and dry.     Findings: No rash.  Neurological:     Mental Status: He is alert.  Psychiatric:        Mood and Affect: Mood normal.        Behavior: Behavior normal.      Results for orders placed or performed in visit on 06/22/21  POCT Urinalysis Dipstick (Automated)  Result Value Ref Range   Color, UA yellow    Clarity, UA clear    Glucose, UA Negative Negative    Bilirubin, UA negative    Ketones, UA negative    Spec Grav, UA 1.020 1.010 - 1.025   Blood, UA negative    pH, UA 6.0 5.0 - 8.0   Protein, UA Negative Negative   Urobilinogen, UA 0.2 0.2 or 1.0 E.U./dL   Nitrite, UA negative    Leukocytes, UA Negative Negative    Assessment & Plan:  This visit occurred during the SARS-CoV-2 public health emergency.  Safety protocols were in place, including screening questions prior to the visit, additional usage of staff PPE, and extensive cleaning of exam room while observing appropriate contact time as indicated for disinfecting solutions.   Problem List Items Addressed This Visit     GERD (gastroesophageal reflux disease)    Controlled with diet changes.       Vitamin D deficiency    Update levels      Relevant Orders   VITAMIN D 25 Hydroxy (Vit-D Deficiency, Fractures)   Low serum vitamin B12    Update levels.       Relevant Orders   Vitamin B12   Indigestion - Primary    Ongoing symptoms over the past month, no red flags and reassuring exam. Check labwork. With significant coffee intake anticipate possible gastritis although doesn't have significant heartburn symptoms - rec start by dropping significant caffeine intake, update if ongoing symptoms. Check labwork today as well.  Will refer to GI for colonoscopy.       Relevant Orders   POCT Urinalysis Dipstick (Automated) (Completed)   Comprehensive metabolic panel   TSH   CBC with Differential/Platelet   Lipase   Other Visit Diagnoses     Need for influenza vaccination       Relevant Orders   Flu Vaccine QUAD 41mo+IM (Fluarix, Fluzone & Alfiuria Quad PF) (Completed)   Special screening for malignant neoplasms, colon       Relevant Orders   Ambulatory referral to Gastroenterology   Special screening for malignant neoplasm of prostate            No orders of the defined types were placed in this encounter.  Orders Placed This Encounter  Procedures   Flu Vaccine QUAD  55mo+IM (Fluarix, Fluzone & Alfiuria Quad PF)   Comprehensive metabolic panel   TSH   CBC with Differential/Platelet   Lipase   Vitamin B12   VITAMIN D 25 Hydroxy (Vit-D Deficiency, Fractures)   Ambulatory referral to Gastroenterology    Referral Priority:  Routine    Referral Type:   Consultation    Referral Reason:   Specialty Services Required    Number of Visits Requested:   1   POCT Urinalysis Dipstick (Automated)     Patient Instructions  Flu shot today  Labs today  Overall normal exam today.  Back off caffeine/coffee - limit to 2-3 cups/day.  We will refer you to GI for colonoscopy.  Let us know if new or ongoing symptoms or not improving with less caffeine.   Follow up plan: Return if symptoms worsen or fail to improve.  Ria Bush, MD

## 2021-06-22 NOTE — Assessment & Plan Note (Addendum)
Ongoing symptoms over the past month, no red flags and reassuring exam. Check labwork. With significant coffee intake anticipate possible gastritis although doesn't have significant heartburn symptoms - rec start by dropping significant caffeine intake, update if ongoing symptoms. Check labwork today as well.  Will refer to GI for colonoscopy.

## 2021-06-22 NOTE — Patient Instructions (Addendum)
Flu shot today  Labs today  Overall normal exam today.  Back off caffeine/coffee - limit to 2-3 cups/day.  We will refer you to GI for colonoscopy.  Let us know if new or ongoing symptoms or not improving with less caffeine.

## 2021-06-23 ENCOUNTER — Telehealth: Payer: Self-pay

## 2021-06-23 NOTE — Telephone Encounter (Signed)
Called no answer

## 2021-06-23 NOTE — Telephone Encounter (Signed)
CALLED PATIENT NO ANSWER LEFT VOICEMAIL FOR A CALL BACK ? ?

## 2021-06-30 ENCOUNTER — Telehealth: Payer: Self-pay

## 2021-06-30 ENCOUNTER — Other Ambulatory Visit: Payer: Self-pay

## 2021-06-30 DIAGNOSIS — Z1211 Encounter for screening for malignant neoplasm of colon: Secondary | ICD-10-CM

## 2021-06-30 MED ORDER — PEG 3350-KCL-NA BICARB-NACL 420 G PO SOLR: 4000.0000 mL | Freq: Once | ORAL | 0 refills | Status: AC

## 2021-06-30 NOTE — Telephone Encounter (Signed)
CALLED PATIENT NO ANSWER LEFT VOICEMAIL FOR A CALL BACK ? ?

## 2021-06-30 NOTE — Progress Notes (Signed)
Gastroenterology Pre-Procedure Review  Request Date: 07/19/2021 Requesting Physician: Dr. Vicente Males  PATIENT REVIEW QUESTIONS: The patient responded to the following health history questions as indicated:    1. Are you having any GI issues? no 2. Do you have a personal history of Polyps? no 3. Do you have a family history of Colon Cancer or Polyps? yes (prostate cancer in family) 4. Diabetes Mellitus? no 5. Joint replacements in the past 12 months?no 6. Major health problems in the past 3 months?no 7. Any artificial heart valves, MVP, or defibrillator?no    MEDICATIONS & ALLERGIES:    Patient reports the following regarding taking any anticoagulation/antiplatelet therapy:   Plavix, Coumadin, Eliquis, Xarelto, Lovenox, Pradaxa, Brilinta, or Effient? no Aspirin? no  Patient confirms/reports the following medications:  Current Outpatient Medications  Medication Sig Dispense Refill   cetirizine (ZYRTEC) 10 MG tablet Take 10 mg by mouth daily as needed.      Cholecalciferol (VITAMIN D) 50 MCG (2000 UT) CAPS Take 1 capsule (2,000 Units total) by mouth daily. 30 capsule    mometasone (NASONEX) 50 MCG/ACT nasal spray PLACE 1 SPRAY INTO THE NOSE DAILY AS NEEDED (ALLERGIES). EACH NOSTRIL 17 each 11   vitamin B-12 (CYANOCOBALAMIN) 500 MCG tablet Take 1 tablet (500 mcg total) by mouth daily.     No current facility-administered medications for this visit.    Patient confirms/reports the following allergies:  No Known Allergies  No orders of the defined types were placed in this encounter.   AUTHORIZATION INFORMATION Primary Insurance: 1D#: Group #:  Secondary Insurance: 1D#: Group #:  SCHEDULE INFORMATION: Date: 07/19/2021 Time: Location:[facility]

## 2021-06-30 NOTE — Telephone Encounter (Signed)
Pt was returning your phone call

## 2021-06-30 NOTE — Telephone Encounter (Signed)
CALLED PATIENT NO ANSWER LEFT VOICEMAIL FOR A CALL BACK °Letter sent °

## 2021-07-18 ENCOUNTER — Telehealth: Payer: Self-pay

## 2021-07-18 NOTE — Telephone Encounter (Signed)
Patient left a voicemail because he has some questions about the prep for his colonoscopy on 07/19/21

## 2021-07-18 NOTE — Telephone Encounter (Signed)
Called patient back he wanted to reschedule till 08/16/2021 sent new communications and called endo sent new refferal to [facility]

## 2021-08-15 ENCOUNTER — Encounter: Payer: Self-pay | Admitting: Gastroenterology

## 2021-08-15 ENCOUNTER — Telehealth: Payer: Self-pay

## 2021-08-15 ENCOUNTER — Ambulatory Visit: Payer: BC Managed Care – PPO | Admitting: Gastroenterology

## 2021-08-15 NOTE — Telephone Encounter (Signed)
Pt lmovm requesting a call back to reschedule procedure for tomorrow 08/16/21

## 2021-08-16 ENCOUNTER — Ambulatory Visit
Admission: RE | Admit: 2021-08-16 | Payer: BC Managed Care – PPO | Source: Home / Self Care | Admitting: Gastroenterology

## 2021-08-16 ENCOUNTER — Encounter: Admission: RE | Payer: Self-pay | Source: Home / Self Care

## 2021-08-16 SURGERY — COLONOSCOPY WITH PROPOFOL
Anesthesia: General

## 2021-08-16 NOTE — Telephone Encounter (Signed)
Patient was contacted and had to leave him a detailed message to call our office back so we could reschedule his procedure. I will also reach out via [place].

## 2022-02-13 ENCOUNTER — Other Ambulatory Visit: Payer: Self-pay

## 2022-02-13 DIAGNOSIS — Z1211 Encounter for screening for malignant neoplasm of colon: Secondary | ICD-10-CM

## 2022-02-24 DIAGNOSIS — D126 Benign neoplasm of colon, unspecified: Secondary | ICD-10-CM

## 2022-03-13 ENCOUNTER — Other Ambulatory Visit: Payer: Self-pay | Admitting: Gastroenterology

## 2022-03-13 ENCOUNTER — Other Ambulatory Visit: Payer: Self-pay

## 2022-03-13 MED ORDER — NA SULFATE-K SULFATE-MG SULF 17.5-3.13-1.6 GM/177ML PO SOLN: 1.0000 | Freq: Once | ORAL | 0 refills | Status: AC

## 2022-03-14 ENCOUNTER — Encounter
Admission: RE | Disposition: A | Discharge status: Home / Self Care | Payer: Self-pay | Source: Home / Self Care | Attending: Gastroenterology

## 2022-03-14 ENCOUNTER — Encounter: Payer: Self-pay | Admitting: Gastroenterology

## 2022-03-14 ENCOUNTER — Ambulatory Visit: Payer: BC Managed Care – PPO | Admitting: Certified Registered Nurse Anesthetist

## 2022-03-14 ENCOUNTER — Ambulatory Visit
Admission: RE | Admit: 2022-03-14 | Discharge: 2022-03-14 | Disposition: A | Discharge status: Home / Self Care | Payer: BC Managed Care – PPO | Attending: Gastroenterology | Admitting: Gastroenterology

## 2022-03-14 DIAGNOSIS — Z0000 Encounter for general adult medical examination without abnormal findings: Secondary | ICD-10-CM

## 2022-03-14 DIAGNOSIS — D126 Benign neoplasm of colon, unspecified: Secondary | ICD-10-CM | POA: Diagnosis not present

## 2022-03-14 DIAGNOSIS — Z1211 Encounter for screening for malignant neoplasm of colon: Secondary | ICD-10-CM

## 2022-03-14 DIAGNOSIS — D122 Benign neoplasm of ascending colon: Secondary | ICD-10-CM | POA: Insufficient documentation

## 2022-03-14 DIAGNOSIS — D120 Benign neoplasm of cecum: Secondary | ICD-10-CM | POA: Insufficient documentation

## 2022-03-14 HISTORY — PX: COLONOSCOPY WITH PROPOFOL: SHX5780

## 2022-03-14 SURGERY — COLONOSCOPY WITH PROPOFOL
Anesthesia: General

## 2022-03-14 MED ORDER — PROPOFOL 10 MG/ML IV BOLUS
INTRAVENOUS | Status: DC | PRN
  Administered 2022-03-14: 70 mg via INTRAVENOUS

## 2022-03-14 MED ORDER — PROPOFOL 500 MG/50ML IV EMUL
INTRAVENOUS | Status: DC | PRN
  Administered 2022-03-14: 170 ug/kg/min via INTRAVENOUS

## 2022-03-14 MED ORDER — GLYCOPYRROLATE 0.2 MG/ML IJ SOLN
INTRAMUSCULAR | Status: AC
  Filled 2022-03-14: qty 1

## 2022-03-14 MED ORDER — SODIUM CHLORIDE 0.9 % IV SOLN: INTRAVENOUS | Status: DC

## 2022-03-14 MED ORDER — STERILE WATER FOR IRRIGATION IR SOLN
Status: DC | PRN
  Administered 2022-03-14 (×2): 60 mL

## 2022-03-14 MED ORDER — LIDOCAINE HCL (CARDIAC) PF 100 MG/5ML IV SOSY
PREFILLED_SYRINGE | INTRAVENOUS | Status: DC | PRN
  Administered 2022-03-14: 50 mg via INTRAVENOUS

## 2022-03-14 MED ORDER — GLYCOPYRROLATE 0.2 MG/ML IJ SOLN
INTRAMUSCULAR | Status: DC | PRN
  Administered 2022-03-14: .2 mg via INTRAVENOUS

## 2022-03-14 NOTE — Anesthesia Procedure Notes (Signed)
Procedure Name: MAC Date/Time: 03/14/2022 10:22 AM  Performed by: Tollie Eth, CRNAPre-anesthesia Checklist: Patient identified, Emergency Drugs available, Suction available and Patient being monitored Patient Re-evaluated:Patient Re-evaluated prior to induction Oxygen Delivery Method: Nasal cannula Induction Type: IV induction Placement Confirmation: positive ETCO2

## 2022-03-14 NOTE — Anesthesia Postprocedure Evaluation (Signed)
Anesthesia Post Note  Patient: Ricky Watkins  Procedure(s) Performed: COLONOSCOPY WITH PROPOFOL  Patient location during evaluation: Endoscopy Anesthesia Type: General Level of consciousness: awake and alert Pain management: pain level controlled Vital Signs Assessment: post-procedure vital signs reviewed and stable Respiratory status: spontaneous breathing, nonlabored ventilation, respiratory function stable and patient connected to nasal cannula oxygen Cardiovascular status: blood pressure returned to baseline and stable Postop Assessment: no apparent nausea or vomiting Anesthetic complications: no   No notable events documented.   Last Vitals:  Vitals:   03/14/22 1101 03/14/22 1112  BP: 119/71   Pulse:    Resp:    Temp:    SpO2:  98%    Last Pain:  Vitals:   03/14/22 1112  TempSrc:   PainSc: 0-No pain                 Ilene Qua

## 2022-03-14 NOTE — Transfer of Care (Signed)
Immediate Anesthesia Transfer of Care Note  Patient: Ricky Watkins  Procedure(s) Performed: COLONOSCOPY WITH PROPOFOL  Patient Location: Endoscopy Unit  Anesthesia Type:General  Level of Consciousness: drowsy  Airway & Oxygen Therapy: Patient Spontanous Breathing  Post-op Assessment: Report given to RN and Post -op Vital signs reviewed and stable  Post vital signs: Reviewed and stable  Last Vitals:  Vitals Value Taken Time  BP 129/87 03/14/22 1053  Temp    Pulse 87 03/14/22 1053  Resp 16 03/14/22 1053  SpO2 97 % 03/14/22 1053  Vitals shown include unvalidated device data.  Last Pain:  Vitals:   03/14/22 1051  TempSrc:   PainSc: 0-No pain         Complications: No notable events documented.

## 2022-03-14 NOTE — Anesthesia Preprocedure Evaluation (Signed)
Anesthesia Evaluation  Patient identified by MRN, date of birth, ID band Patient awake    Reviewed: Allergy & Precautions, NPO status , Patient's Chart, lab work & pertinent test results  Airway Mallampati: III  TM Distance: >3 FB Neck ROM: Full    Dental no notable dental hx.    Pulmonary neg pulmonary ROS,    Pulmonary exam normal breath sounds clear to auscultation       Cardiovascular Exercise Tolerance: Good negative cardio ROS Normal cardiovascular exam Rhythm:Regular Rate:Normal     Neuro/Psych negative neurological ROS  negative psych ROS   GI/Hepatic Neg liver ROS, GERD  ,  Endo/Other  negative endocrine ROS  Renal/GU negative Renal ROS  negative genitourinary   Musculoskeletal negative musculoskeletal ROS (+)   Abdominal   Peds negative pediatric ROS (+)  Hematology negative hematology ROS (+)   Anesthesia Other Findings   Reproductive/Obstetrics negative OB ROS                             Anesthesia Physical Anesthesia Plan  ASA: 2  Anesthesia Plan: General   Post-op Pain Management: Minimal or no pain anticipated   Induction: Intravenous  PONV Risk Score and Plan: 1 and Propofol infusion and TIVA  Airway Management Planned: Natural Airway and Nasal Cannula  Additional Equipment:   Intra-op Plan:   Post-operative Plan:   Informed Consent: I have reviewed the patients History and Physical, chart, labs and discussed the procedure including the risks, benefits and alternatives for the proposed anesthesia with the patient or authorized representative who has indicated his/her understanding and acceptance.     Dental Advisory Given  Plan Discussed with: Anesthesiologist, CRNA and Surgeon  Anesthesia Plan Comments: (Patient consented for risks of anesthesia including but not limited to:  - adverse reactions to medications - risk of airway placement if  required - damage to eyes, teeth, lips or other oral mucosa - nerve damage due to positioning  - sore throat or hoarseness - Damage to heart, brain, nerves, lungs, other parts of body or loss of life  Patient voiced understanding.)        Anesthesia Quick Evaluation

## 2022-03-14 NOTE — Op Note (Signed)
[facility]astroenterology Patient Name: Ricky Watkins Procedure Date: 03/14/2022 10:16 AM MRN: [id number] Account #: [id number] Date of Birth: 14-Feb-1976 Admit Type: Outpatient Age: 46 Room: [facility] ENDO ROOM 2 Gender: Male Note Status: Finalized Instrument Name: Jasper Riling [id number] Procedure:             Colonoscopy Indications:           Screening for colorectal malignant neoplasm Providers:             Jonathon Bellows MD, MD Referring MD:          Ria Bush (Referring MD) Medicines:             Monitored Anesthesia Care Complications:         No immediate complications. Procedure:             Pre-Anesthesia Assessment:                        - Prior to the procedure, a History and Physical was                         performed, and patient medications, allergies and                         sensitivities were reviewed. The patient's tolerance                         of previous anesthesia was reviewed.                        - The risks and benefits of the procedure and the                         sedation options and risks were discussed with the                         patient. All questions were answered and informed                         consent was obtained.                        - ASA Grade Assessment: II - A patient with mild                         systemic disease.                        After obtaining informed consent, the colonoscope was                         passed under direct vision. Throughout the procedure,                         the patient's blood pressure, pulse, and oxygen                         saturations were monitored continuously. The                         Colonoscope was introduced through  the anus and                         advanced to the the cecum, identified by the                         appendiceal orifice. The colonoscopy was performed                         with ease. The patient tolerated the procedure well.                          The quality of the bowel preparation was excellent. Findings:      The perianal and digital rectal examinations were normal.      Two sessile polyps were found in the ascending colon and cecum. The       polyps were 4 to 6 mm in size. These polyps were removed with a cold       snare. Resection and retrieval were complete.      The exam was otherwise without abnormality on direct and retroflexion       views. Impression:            - Two 4 to 6 mm polyps in the ascending colon and in                         the cecum, removed with a cold snare. Resected and                         retrieved.                        - The examination was otherwise normal on direct and                         retroflexion views. Recommendation:        - Discharge patient to home (with escort).                        - Resume previous diet.                        - Continue present medications.                        - Await pathology results.                        - Repeat colonoscopy in 5 years for surveillance. Procedure Code(s):     --- Professional ---                        [phone], Colonoscopy, flexible; with removal of                         tumor(s), polyp(s), or other lesion(s) by snare                         technique Diagnosis Code(s):     --- Professional ---  Z12.11, Encounter for screening for malignant neoplasm                         of colon                        K63.5, Polyp of colon CPT copyright 2019 American Medical Association. All rights reserved. The codes documented in this report are preliminary and upon coder review may  be revised to meet current compliance requirements. Jonathon Bellows, MD Jonathon Bellows MD, MD 03/14/2022 10:50:23 AM This report has been signed electronically. Number of Addenda: 0 Note Initiated On: 03/14/2022 10:16 AM Scope Withdrawal Time: 0 hours 14 minutes 7 seconds  Total Procedure Duration: 0 hours 17 minutes 10  seconds  Estimated Blood Loss:  Estimated blood loss: none.      [facility]

## 2022-03-14 NOTE — H&P (Signed)
Jonathon Bellows, MD [address], [place], [place], [place], [address] [address], [place], [place], [place], [address] Phone: [phone]  Fax: [phone]  Primary Care Physician:  Ria Bush, MD   Pre-Procedure History & Physical: HPI:  Ricky Watkins is a 46 y.o. male is here for an colonoscopy.   Past Medical History:  Diagnosis Date   Allergic rhinitis    History of herniated intervertebral disc     Past Surgical History:  Procedure Laterality Date   NO PAST SURGERIES      Prior to Admission medications   Medication Sig Start Date End Date Taking? Authorizing Provider  cetirizine (ZYRTEC) 10 MG tablet Take 10 mg by mouth daily as needed.    Yes [provider]  Cholecalciferol (VITAMIN D) 50 MCG (2000 UT) CAPS Take 1 capsule (2,000 Units total) by mouth daily. 06/22/21  Yes Ria Bush, MD  mometasone (NASONEX) 50 MCG/ACT nasal spray PLACE 1 SPRAY INTO THE NOSE DAILY AS NEEDED (ALLERGIES). EACH NOSTRIL 09/01/20  Yes Ria Bush, MD  vitamin B-12 (CYANOCOBALAMIN) 500 MCG tablet Take 1 tablet (500 mcg total) by mouth daily. 06/22/21  Yes Ria Bush, MD    Allergies as of 02/14/2022   (No Known Allergies)    Family History  Problem Relation Age of Onset   Cancer Father 3       prostate   Cancer Paternal Aunt        breast   Stroke Paternal Uncle    Hypertension Neg Hx    Diabetes Neg Hx    CAD Neg Hx     Social History   Socioeconomic History   Marital status: Married    Spouse name: Not on file   Number of children: Not on file   Years of education: Not on file   Highest education level: Not on file  Occupational History   Not on file  Tobacco Use   Smoking status: Never   Smokeless tobacco: Never  Vaping Use   Vaping Use: Never used  Substance and Sexual Activity   Alcohol use: Yes    Comment: rarley   Drug use: No   Sexual activity: Not on file  Other Topics Concern   Not on file  Social  History Narrative   Lives with wife and 2 children, no pets    Married    Occupation: Radio producer for [employer]    Activity: attends gym 4-5x/wk    Diet: good water, fruits/vegetables daily, no sodas.   Social Determinants of Health   Financial Resource Strain: Not on file  Food Insecurity: Not on file  Transportation Needs: Not on file  Physical Activity: Not on file  Stress: Not on file  Social Connections: Not on file  Intimate Partner Violence: Not on file    Review of Systems: See HPI, otherwise negative ROS  Physical Exam: BP (!) 139/99   Pulse 78   Temp 97.9 F (36.6 C) (Temporal)   Resp 17   Ht '6\' 5"'$  (1.956 m)   Wt 105.7 kg   SpO2 100%   BMI 27.63 kg/m  General:   Alert,  pleasant and cooperative in NAD Head:  Normocephalic and atraumatic. Neck:  Supple; no masses or thyromegaly. Lungs:  Clear throughout to auscultation, normal respiratory effort.    Heart:  +S1, +S2, Regular rate and rhythm, No edema. Abdomen:  Soft, nontender and nondistended. Normal bowel sounds, without guarding, and without rebound.  Neurologic:  Alert and  oriented x4;  grossly normal neurologically.  Impression/Plan: Ricky Watkins is here for an colonoscopy to be performed for Screening colonoscopy average risk   Risks, benefits, limitations, and alternatives regarding  colonoscopy have been reviewed with the patient.  Questions have been answered.  All parties agreeable.   Jonathon Bellows, MD  03/14/2022, 10:19 AM

## 2022-03-15 ENCOUNTER — Encounter: Payer: Self-pay | Admitting: Gastroenterology

## 2022-03-15 LAB — SURGICAL PATHOLOGY

## 2022-03-16 ENCOUNTER — Encounter: Payer: Self-pay | Admitting: Gastroenterology

## 2022-03-16 ENCOUNTER — Encounter: Payer: Self-pay | Admitting: Family Medicine

## 2023-12-03 ENCOUNTER — Ambulatory Visit (INDEPENDENT_AMBULATORY_CARE_PROVIDER_SITE_OTHER): Admitting: Family Medicine

## 2023-12-03 ENCOUNTER — Ambulatory Visit: Payer: Self-pay

## 2023-12-03 ENCOUNTER — Encounter: Payer: Self-pay | Admitting: Family Medicine

## 2023-12-03 VITALS — BP 122/84 | HR 81 | Temp 98.0°F | Ht 77.0 in | Wt 240.0 lb

## 2023-12-03 DIAGNOSIS — G629 Polyneuropathy, unspecified: Secondary | ICD-10-CM

## 2023-12-03 DIAGNOSIS — G5711 Meralgia paresthetica, right lower limb: Secondary | ICD-10-CM | POA: Diagnosis not present

## 2023-12-03 DIAGNOSIS — E559 Vitamin D deficiency, unspecified: Secondary | ICD-10-CM | POA: Diagnosis not present

## 2023-12-03 DIAGNOSIS — E538 Deficiency of other specified B group vitamins: Secondary | ICD-10-CM | POA: Diagnosis not present

## 2023-12-03 NOTE — Telephone Encounter (Signed)
    FYI Only or Action Required?: FYI only for provider  Patient was last seen in primary care on 06/22/2021 by Claire Crick, MD. Called Nurse Triage reporting Numbness. Symptoms began about a month ago. Interventions attempted: Nothing. Symptoms are: unchanged.  Triage Disposition: No disposition on file.  Patient/caregiver understands and will follow disposition?: Copied from CRM [id number]. Topic: Clinical - Red Word Triage >> Dec 03, 2023  8:44 AM Baldomero Bone wrote: Red Word that prompted transfer to Nurse Triage: Patient has numbness in skin on the right side of leg just above the knee about the size of a notecard. Burns occasionally. Callback number is [phone] Reason for Disposition  [1] Numbness or tingling on both sides of body AND [2] is a new symptom present > 24 hours  Answer Assessment - Initial Assessment Questions 1. SYMPTOM: "What is the main symptom you are concerned about?" (e.g., weakness, numbness)     Numbness on right side of leg just above the knee, size of a note care 2. ONSET: "When did this start?" (minutes, hours, days; while sleeping)     A month and a half ago 3. LAST NORMAL: "When was the last time you (the patient) were normal (no symptoms)?"     A month and a half ago 4. PATTERN "Does this come and go, or has it been constant since it started?"  "Is it present now?"     "Don't know" 5. CARDIAC SYMPTOMS: "Have you had any of the following symptoms: chest pain, difficulty breathing, palpitations?"     Denies  6. NEUROLOGIC SYMPTOMS: "Have you had any of the following symptoms: headache, dizziness, vision loss, double vision, changes in speech, unsteady on your feet?"     no 7. OTHER SYMPTOMS: "Do you have any other symptoms?"     denies 8. PREGNANCY: "Is there any chance you are pregnant?" "When was your last menstrual period?"     na  Protocols used: Neurologic Deficit-A-AH

## 2023-12-03 NOTE — Progress Notes (Signed)
 Ph: [phone] Fax: [phone]   Patient ID: Ricky Watkins, male    DOB: 1975/11/25, 48 y.o.   MRN: [id number]  This visit was conducted in person.  BP 122/84   Pulse 81   Temp 98 F (36.7 C) (Oral)   Ht 6\' 5"  (1.956 m)   Wt 240 lb (108.9 kg)   SpO2 96%   BMI 28.46 kg/m    CC: R leg numbness Subjective:   HPI: Ricky Watkins is a 48 y.o. male presenting on 12/03/2023 for Numbness (C/o R upper leg numbness. Started several mos ago. )   Last seen 05/2021.  New role at work over last several years - significant for prolonged travel /driving in the car.   Several months of R lateral distal thigh numbness. Denies inciting trauma /injury or falls. Occasional burning sensation to that area.   No significant lower back pain, no shooting pain down leg.  No hip or knee pain.  No numbness to rest of body. Notes some morning stiffness to lower back, no other joints affected.      Relevant past medical, surgical, family and social history reviewed and updated as indicated. Interim medical history since our last visit reviewed. Allergies and medications reviewed and updated. Outpatient Medications Prior to Visit  Medication Sig Dispense Refill   cetirizine (ZYRTEC) 10 MG tablet Take 10 mg by mouth daily as needed.      mometasone  (NASONEX ) 50 MCG/ACT nasal spray PLACE 1 SPRAY INTO THE NOSE DAILY AS NEEDED (ALLERGIES). EACH NOSTRIL 17 each 11   Cholecalciferol (VITAMIN D ) 50 MCG (2000 UT) CAPS Take 1 capsule (2,000 Units total) by mouth daily. 30 capsule    vitamin B-12 (CYANOCOBALAMIN ) 500 MCG tablet Take 1 tablet (500 mcg total) by mouth daily.     No facility-administered medications prior to visit.     Per HPI unless specifically indicated in ROS section below Review of Systems  Objective:  BP 122/84   Pulse 81   Temp 98 F (36.7 C) (Oral)   Ht 6\' 5"  (1.956 m)   Wt 240 lb (108.9 kg)   SpO2 96%   BMI 28.46 kg/m   Wt Readings from Last 3 Encounters:   12/03/23 240 lb (108.9 kg)  03/14/22 233 lb (105.7 kg)  06/22/21 253 lb 9 oz (115 kg)      Physical Exam Vitals and nursing note reviewed.  Constitutional:      Appearance: Normal appearance. He is not ill-appearing.  HENT:     Mouth/Throat:     Mouth: Mucous membranes are moist.     Pharynx: Oropharynx is clear. No oropharyngeal exudate or posterior oropharyngeal erythema.  Eyes:     Extraocular Movements: Extraocular movements intact.     Conjunctiva/sclera: Conjunctivae normal.     Pupils: Pupils are equal, round, and reactive to light.  Cardiovascular:     Rate and Rhythm: Normal rate and regular rhythm.     Pulses: Normal pulses.     Heart sounds: Normal heart sounds. No murmur heard. Pulmonary:     Effort: Pulmonary effort is normal. No respiratory distress.     Breath sounds: Normal breath sounds. No wheezing, rhonchi or rales.  Musculoskeletal:        General: No tenderness.     Right lower leg: No edema.     Left lower leg: No edema.       Legs:  Skin:    General: Skin is warm and dry.  Findings: No erythema or rash.  Neurological:     Mental Status: He is alert.     Sensory: Sensory deficit present.     Motor: Motor function is intact.     Coordination: Coordination is intact.     Gait: Gait is intact.     Comments:  5/5 strength BLE Equally diminished patellar DTRs bilaterally Diminished sensation to light touch and decreased temperature discrimination to patch of skin lateral R thigh  Psychiatric:        Mood and Affect: Mood normal.        Behavior: Behavior normal.       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 Discussed with patient. Avoid tight fitting clothes. Anticipate prolonged periods driving may be exacerbating condition - encouraged frequent walking breaks. Will further evaluate with labwork in h/o vit B12 deficiency.       Other Visit Diagnoses       Neuropathy       Relevant Orders   TSH   Vitamin B12   Basic metabolic panel with GFR        No orders of the defined types were placed in this encounter.   Orders Placed This Encounter  Procedures   TSH   Vitamin B12   Basic metabolic panel with GFR   VITAMIN D  25 Hydroxy (Vit-D Deficiency, Fractures)    Patient Instructions  Labs today  Sounds like meralgia paresthetica - handout provided.  Avoid tight fitting pants/underwear.   Follow up plan: Return if symptoms worsen or fail to improve, for annual exam, prior fasting for blood work.  Claire Crick, MD

## 2023-12-03 NOTE — Assessment & Plan Note (Addendum)
Update b12 levels off replacement

## 2023-12-03 NOTE — Assessment & Plan Note (Signed)
Update vit D levels off replacement.

## 2023-12-03 NOTE — Patient Instructions (Addendum)
 Labs today  Sounds like meralgia paresthetica - handout provided.  Avoid tight fitting pants/underwear.

## 2023-12-03 NOTE — Assessment & Plan Note (Signed)
 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 Discussed with patient. Avoid tight fitting clothes. Anticipate prolonged periods driving may be exacerbating condition - encouraged frequent walking breaks. Will further evaluate with labwork in h/o vit B12 deficiency.

## 2023-12-10 ENCOUNTER — Ambulatory Visit: Payer: Self-pay | Admitting: Family Medicine

## 2023-12-10 MED ORDER — VITAMIN B-12 1000 MCG PO TABS: 1000.0000 ug | ORAL_TABLET | Freq: Every day | ORAL | Status: AC

## 2023-12-10 MED ORDER — VITAMIN D3 25 MCG (1000 UT) PO CAPS
1.0000 | ORAL_CAPSULE | Freq: Every day | ORAL | Status: DC
Start: 2023-12-10 — End: 2023-12-11

## 2023-12-11 MED ORDER — VITAMIN D3 25 MCG (1000 UT) PO CAPS: 2.0000 | ORAL_CAPSULE | Freq: Every day | ORAL | Status: DC

## 2024-02-03 ENCOUNTER — Other Ambulatory Visit: Payer: Self-pay | Admitting: Family Medicine

## 2024-02-03 DIAGNOSIS — Z131 Encounter for screening for diabetes mellitus: Secondary | ICD-10-CM

## 2024-02-03 DIAGNOSIS — Z125 Encounter for screening for malignant neoplasm of prostate: Secondary | ICD-10-CM

## 2024-02-03 DIAGNOSIS — Z13220 Encounter for screening for lipoid disorders: Secondary | ICD-10-CM

## 2024-02-03 DIAGNOSIS — E559 Vitamin D deficiency, unspecified: Secondary | ICD-10-CM

## 2024-02-03 DIAGNOSIS — E538 Deficiency of other specified B group vitamins: Secondary | ICD-10-CM

## 2024-02-06 ENCOUNTER — Other Ambulatory Visit

## 2024-02-06 DIAGNOSIS — Z125 Encounter for screening for malignant neoplasm of prostate: Secondary | ICD-10-CM

## 2024-02-06 DIAGNOSIS — E538 Deficiency of other specified B group vitamins: Secondary | ICD-10-CM

## 2024-02-06 DIAGNOSIS — E559 Vitamin D deficiency, unspecified: Secondary | ICD-10-CM | POA: Diagnosis not present

## 2024-02-06 DIAGNOSIS — Z13220 Encounter for screening for lipoid disorders: Secondary | ICD-10-CM

## 2024-02-06 DIAGNOSIS — Z131 Encounter for screening for diabetes mellitus: Secondary | ICD-10-CM | POA: Diagnosis not present

## 2024-02-06 DIAGNOSIS — Z136 Encounter for screening for cardiovascular disorders: Secondary | ICD-10-CM | POA: Diagnosis not present

## 2024-02-06 LAB — COMPREHENSIVE METABOLIC PANEL WITH GFR
ALT: 13 U/L (ref 0–53)
AST: 16 U/L (ref 0–37)
Albumin: 4.4 g/dL (ref 3.5–5.2)
Alkaline Phosphatase: 47 U/L (ref 39–117)
BUN: 12 mg/dL (ref 6–23)
CO2: 27 meq/L (ref 19–32)
Calcium: 9.2 mg/dL (ref 8.4–10.5)
Chloride: 103 meq/L (ref 96–112)
Creatinine, Ser: 1.11 mg/dL (ref 0.40–1.50)
GFR: 78.49 mL/min (ref >60.00)
Glucose, Bld: 92 mg/dL (ref 70–99)
Potassium: 4.2 meq/L (ref 3.5–5.1)
Sodium: 139 meq/L (ref 135–145)
Total Bilirubin: 0.5 mg/dL (ref 0.2–1.2)
Total Protein: 6.6 g/dL (ref 6.0–8.3)

## 2024-02-06 LAB — LIPID PANEL
Cholesterol: 125 mg/dL (ref 0–200)
HDL: 57.5 mg/dL (ref >39.00)
LDL Cholesterol: 59 mg/dL (ref 0–99)
NonHDL: 67.96
Total CHOL/HDL Ratio: 2
Triglycerides: 47 mg/dL (ref 0.0–149.0)
VLDL: 9.4 mg/dL (ref 0.0–40.0)

## 2024-02-06 LAB — VITAMIN B12: Vitamin B-12: 347 pg/mL (ref 211–911)

## 2024-02-06 LAB — VITAMIN D 25 HYDROXY (VIT D DEFICIENCY, FRACTURES): VITD: 21.39 ng/mL — ABNORMAL LOW (ref 30.00–100.00)

## 2024-02-06 LAB — PSA: PSA: 0.29 ng/mL (ref 0.10–4.00)

## 2024-02-08 ENCOUNTER — Ambulatory Visit: Payer: Self-pay | Admitting: Family Medicine

## 2024-02-13 ENCOUNTER — Encounter: Payer: Self-pay | Admitting: Family Medicine

## 2024-02-13 ENCOUNTER — Ambulatory Visit (INDEPENDENT_AMBULATORY_CARE_PROVIDER_SITE_OTHER): Admitting: Family Medicine

## 2024-02-13 VITALS — BP 124/86 | HR 87 | Temp 98.2°F | Ht 77.0 in | Wt 237.1 lb

## 2024-02-13 DIAGNOSIS — E559 Vitamin D deficiency, unspecified: Secondary | ICD-10-CM | POA: Diagnosis not present

## 2024-02-13 DIAGNOSIS — D126 Benign neoplasm of colon, unspecified: Secondary | ICD-10-CM

## 2024-02-13 DIAGNOSIS — E538 Deficiency of other specified B group vitamins: Secondary | ICD-10-CM | POA: Diagnosis not present

## 2024-02-13 DIAGNOSIS — Z0000 Encounter for general adult medical examination without abnormal findings: Secondary | ICD-10-CM

## 2024-02-13 DIAGNOSIS — G5711 Meralgia paresthetica, right lower limb: Secondary | ICD-10-CM

## 2024-02-13 MED ORDER — CYANOCOBALAMIN 1000 MCG/ML IJ SOLN
1000.0000 ug | Freq: Once | INTRAMUSCULAR | Status: AC
  Administered 2024-02-13: 1000 ug via INTRAMUSCULAR

## 2024-02-13 MED ORDER — VITAMIN D3 50 MCG (2000 UT) PO CAPS: 2000.0000 [IU] | ORAL_CAPSULE | Freq: Every day | ORAL | Status: AC

## 2024-02-13 NOTE — Patient Instructions (Addendum)
 Check on date of latest tetanus shot and if Td or Tdap.  B12 shot today then continue b12 1000mcg daily.  Continue vitamin D3 2000 units daily.  Good to see you today Return as needed or in 1 year for next physical  Bedtime routine checklist: 1. Avoid naps during the day 2. Avoid stimulants such as caffeine and nicotine. Avoid bedtime alcohol (it can speed onset of sleep but the body's metabolism can cause awakenings). 3. All forms of exercise help ensure sound sleep - limit vigorous exercise to morning or late afternoon 4. Avoid food too close to bedtime including chocolate (which contains caffeine) 5. Soak up natural light 6. Establish regular bedtime routine. 7. Associate bed with sleep - avoid TV, computer or phone, reading while in bed. 8. Ensure pleasant, relaxing sleep environment - quiet, dark, cool room.

## 2024-02-13 NOTE — Assessment & Plan Note (Signed)
 Preventative protocols reviewed and updated unless pt declined. Discussed healthy diet and lifestyle.

## 2024-02-13 NOTE — Assessment & Plan Note (Addendum)
 Levels improving on daily b12 replacement 1000mcg - continue.  B12 shot today then continue 1000mcg daily.

## 2024-02-13 NOTE — Assessment & Plan Note (Signed)
 Levels improving on vit D3 2000 units daily - continue.

## 2024-02-13 NOTE — Progress Notes (Signed)
 Ph: [phone] Fax: [phone]   Patient ID: Ricky Watkins, male    DOB: [birth year], 48 y.o.   MRN: [id number]  This visit was conducted in person.  BP 124/86   Pulse 87   Temp 98.2 F (36.8 C) (Oral)   Ht 6' 5 (1.956 m)   Wt 237 lb 2 oz (107.6 kg)   SpO2 96%   BMI 28.12 kg/m    CC: CPE Subjective:   HPI: Ricky Watkins is a 48 y.o. 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  Doing intermittent fasting.  Sleep latency - short  Screen time - stops prior to bedtime.   Preventative: Colonoscopy 02/2022 - SSP x2, rpt 7 yrs Romero) Prostate cancer screening - fmhx in father age 55yo. Nocturia x1. No weakening stream.  Flu shot yearly Td 2003, may have had Tdap 2020s through [employer] - will check Covid vaccine x3 Seat belt use discussed  Sunscreen use discussed. No changing moles on skin.  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 Interim medical history since our last visit reviewed. Allergies and medications reviewed and updated. Outpatient Medications Prior to Visit  Medication Sig Dispense Refill   cetirizine (ZYRTEC) 10 MG tablet Take 10 mg by mouth daily as needed.      cyanocobalamin  (VITAMIN B12) 1000 MCG tablet Take 1 tablet (1,000 mcg total) by mouth daily.     mometasone  (NASONEX ) 50 MCG/ACT nasal spray PLACE 1 SPRAY INTO THE NOSE DAILY AS NEEDED (ALLERGIES). EACH NOSTRIL 17 each 11   Cholecalciferol (VITAMIN D3)  25 MCG (1000 UT) CAPS Take 2 capsules (2,000 Units total) by mouth daily.     No facility-administered medications prior to visit.     Per HPI unless specifically indicated in ROS section below Review of Systems  Constitutional:  Negative for activity change, appetite change, chills, fatigue, fever and unexpected weight change.  HENT:  Negative for hearing loss.   Eyes:  Negative for visual disturbance.  Respiratory:  Negative for cough, chest tightness, shortness of breath and wheezing.   Cardiovascular:  Positive for chest pain (occ throbbing substernal pain). Negative for palpitations and leg swelling.  Gastrointestinal:  Negative for abdominal distention, abdominal pain, blood in stool, constipation, diarrhea, nausea and vomiting.  Genitourinary:  Negative for difficulty urinating and hematuria.  Musculoskeletal:  Negative for arthralgias, myalgias and neck pain.  Skin:  Negative for rash.  Neurological:  Positive for headaches (imtermittent). Negative for dizziness, seizures and syncope.  Hematological:  Negative for adenopathy. Does not bruise/bleed easily.  Psychiatric/Behavioral:  Negative for dysphoric mood. The patient is not nervous/anxious.     Objective:  BP 124/86   Pulse 87   Temp 98.2 F (36.8 C) (Oral)   Ht 6' 5 (1.956 m)   Wt 237 lb 2 oz (107.6 kg)   SpO2 96%   BMI 28.12 kg/m   Wt Readings from Last 3 Encounters:  02/13/24 237 lb 2  oz (107.6 kg)  12/03/23 240 lb (108.9 kg)  03/14/22 233 lb (105.7 kg)      Physical Exam Vitals and nursing note reviewed.  Constitutional:      General: He is not in acute distress.    Appearance: Normal appearance. He is well-developed. He is not ill-appearing.  HENT:     Head: Normocephalic and atraumatic.     Right Ear: Hearing, tympanic membrane, ear canal and external ear normal.     Left Ear: Hearing, tympanic membrane, ear canal and external ear normal.     Mouth/Throat:     Mouth: Mucous membranes are moist.      Pharynx: Oropharynx is clear. No oropharyngeal exudate or posterior oropharyngeal erythema.  Eyes:     General: No scleral icterus.    Extraocular Movements: Extraocular movements intact.     Conjunctiva/sclera: Conjunctivae normal.     Pupils: Pupils are equal, round, and reactive to light.  Neck:     Thyroid : No thyroid  mass or thyromegaly.  Cardiovascular:     Rate and Rhythm: Normal rate and regular rhythm.     Pulses: Normal pulses.          Radial pulses are 2+ on the right side and 2+ on the left side.     Heart sounds: Normal heart sounds. No murmur heard. Pulmonary:     Effort: Pulmonary effort is normal. No respiratory distress.     Breath sounds: Normal breath sounds. No wheezing, rhonchi or rales.  Abdominal:     General: Bowel sounds are normal. There is no distension.     Palpations: Abdomen is soft. There is no mass.     Tenderness: There is no abdominal tenderness. There is no guarding or rebound.     Hernia: No hernia is present.  Musculoskeletal:        General: Normal range of motion.     Cervical back: Normal range of motion and neck supple.     Right lower leg: No edema.     Left lower leg: No edema.  Lymphadenopathy:     Cervical: No cervical adenopathy.  Skin:    General: Skin is warm and dry.     Findings: No rash.  Neurological:     General: No focal deficit present.     Mental Status: He is alert and oriented to person, place, and time.  Psychiatric:        Mood and Affect: Mood normal.        Behavior: Behavior normal.        Thought Content: Thought content normal.        Judgment: Judgment normal.       Results for orders placed or performed in visit on 02/06/24  Vitamin B12   Collection Time: 02/06/24  7:47 AM  Result Value Ref Range   Vitamin B-12 347 211 - 911 pg/mL  VITAMIN D  25 Hydroxy (Vit-D Deficiency, Fractures)   Collection Time: 02/06/24  7:47 AM  Result Value Ref Range   VITD 21.39 (L) 30.00 - 100.00 ng/mL  PSA   Collection  Time: 02/06/24  7:47 AM  Result Value Ref Range   PSA 0.29 0.10 - 4.00 ng/mL  Comprehensive metabolic panel with GFR   Collection Time: 02/06/24  7:47 AM  Result Value Ref Range   Sodium 139 135 - 145 mEq/L   Potassium 4.2 3.5 - 5.1 mEq/L   Chloride 103 96 - 112 mEq/L   CO2 27 19 - 32  mEq/L   Glucose, Bld 92 70 - 99 mg/dL   BUN 12 6 - 23 mg/dL   Creatinine, Ser 8.88 0.40 - 1.50 mg/dL   Total Bilirubin 0.5 0.2 - 1.2 mg/dL   Alkaline Phosphatase 47 39 - 117 U/L   AST 16 0 - 37 U/L   ALT 13 0 - 53 U/L   Total Protein 6.6 6.0 - 8.3 g/dL   Albumin 4.4 3.5 - 5.2 g/dL   GFR 21.50 >39.99 mL/min   Calcium 9.2 8.4 - 10.5 mg/dL  Lipid panel   Collection Time: 02/06/24  7:47 AM  Result Value Ref Range   Cholesterol 125 0 - 200 mg/dL   Triglycerides 52.9 0.0 - 149.0 mg/dL   HDL 42.49 >60.99 mg/dL   VLDL 9.4 0.0 - 59.9 mg/dL   LDL Cholesterol 59 0 - 99 mg/dL   Total CHOL/HDL Ratio 2    NonHDL 67.96     Assessment & Plan:   Problem List Items Addressed This Visit     Health maintenance examination - Primary (Chronic)   Preventative protocols reviewed and updated unless pt declined. Discussed healthy diet and lifestyle.       Vitamin D  deficiency   Levels improving on vit D3 2000 units daily - continue.       Vitamin B12 deficiency   Levels improving on daily b12 replacement 1000mcg - continue.  B12 shot today then continue 1000mcg daily.       Adenomatous polyp of colon   Meralgia paresthetica, right   Ongoing, mild. Continue to monitor.         Meds ordered this encounter  Medications   Cholecalciferol (VITAMIN D3) 50 MCG (2000 UT) capsule    Sig: Take 1 capsule (2,000 Units total) by mouth daily.   cyanocobalamin  (VITAMIN B12) injection 1,000 mcg    No orders of the defined types were placed in this encounter.   Patient Instructions  Check on date of latest tetanus shot and if Td or Tdap.  B12 shot today then continue b12 1000mcg daily.  Continue vitamin D3  2000 units daily.  Good to see you today Return as needed or in 1 year for next physical  Bedtime routine checklist: 1. Avoid naps during the day 2. Avoid stimulants such as caffeine and nicotine. Avoid bedtime alcohol (it can speed onset of sleep but the body's metabolism can cause awakenings). 3. All forms of exercise help ensure sound sleep - limit vigorous exercise to morning or late afternoon 4. Avoid food too close to bedtime including chocolate (which contains caffeine) 5. Soak up natural light 6. Establish regular bedtime routine. 7. Associate bed with sleep - avoid TV, computer or phone, reading while in bed. 8. Ensure pleasant, relaxing sleep environment - quiet, dark, cool room.   Follow up plan: Return in about 1 year (around 02/12/2025) for annual exam, prior fasting for blood work.  Anton Blas, MD

## 2024-02-13 NOTE — Assessment & Plan Note (Signed)
 Ongoing, mild. Continue to monitor.

## 2025-02-06 ENCOUNTER — Other Ambulatory Visit

## 2025-02-13 ENCOUNTER — Encounter: Admitting: Family Medicine
